# Patient Record
Sex: Female | Born: 1990 | Race: White | Hispanic: No | Marital: Married | Smoking: Former smoker
Health system: Southern US, Community
[De-identification: ages and names within clinical notes are randomized; demographics above are authoritative.]

## PROBLEM LIST (undated history)

## (undated) DIAGNOSIS — F909 Attention-deficit hyperactivity disorder, unspecified type: Secondary | ICD-10-CM

## (undated) DIAGNOSIS — J45909 Unspecified asthma, uncomplicated: Secondary | ICD-10-CM

## (undated) DIAGNOSIS — N946 Dysmenorrhea, unspecified: Secondary | ICD-10-CM

## (undated) DIAGNOSIS — T7840XA Allergy, unspecified, initial encounter: Secondary | ICD-10-CM

## (undated) DIAGNOSIS — L658 Other specified nonscarring hair loss: Secondary | ICD-10-CM

## (undated) HISTORY — DX: Other specified nonscarring hair loss: L65.8

## (undated) HISTORY — DX: Dysmenorrhea, unspecified: N94.6

## (undated) HISTORY — DX: Attention-deficit hyperactivity disorder, unspecified type: F90.9

## (undated) HISTORY — PX: WISDOM TOOTH EXTRACTION: SHX21

## (undated) HISTORY — DX: Allergy, unspecified, initial encounter: T78.40XA

## (undated) HISTORY — DX: Unspecified asthma, uncomplicated: J45.909

---
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
## 2016-12-30 ENCOUNTER — Ambulatory Visit (INDEPENDENT_AMBULATORY_CARE_PROVIDER_SITE_OTHER): Payer: BLUE CROSS/BLUE SHIELD | Admitting: Physician Assistant

## 2016-12-30 ENCOUNTER — Encounter: Payer: Self-pay | Admitting: Physician Assistant

## 2016-12-30 VITALS — BP 122/90 | HR 113 | Temp 98.7°F | Ht 62.0 in | Wt 169.0 lb

## 2016-12-30 DIAGNOSIS — J029 Acute pharyngitis, unspecified: Secondary | ICD-10-CM | POA: Insufficient documentation

## 2016-12-30 DIAGNOSIS — R000 Tachycardia, unspecified: Secondary | ICD-10-CM | POA: Diagnosis not present

## 2016-12-30 DIAGNOSIS — J039 Acute tonsillitis, unspecified: Secondary | ICD-10-CM | POA: Diagnosis not present

## 2016-12-30 DIAGNOSIS — Z7689 Persons encountering health services in other specified circumstances: Secondary | ICD-10-CM

## 2016-12-30 LAB — CBC WITH DIFFERENTIAL/PLATELET
BASOS PCT: 0 %
Basophils Absolute: 0 cells/uL (ref 0–200)
EOS PCT: 0 %
Eosinophils Absolute: 0 cells/uL — ABNORMAL LOW (ref 15–500)
HCT: 42.4 % (ref 35.0–45.0)
Hemoglobin: 14.4 g/dL (ref 11.7–15.5)
LYMPHS PCT: 12 %
Lymphs Abs: 2148 cells/uL (ref 850–3900)
MCH: 29.1 pg (ref 27.0–33.0)
MCHC: 34 g/dL (ref 32.0–36.0)
MCV: 85.7 fL (ref 80.0–100.0)
MONOS PCT: 5 %
MPV: 11 fL (ref 7.5–12.5)
Monocytes Absolute: 895 cells/uL (ref 200–950)
Neutro Abs: 14857 cells/uL — ABNORMAL HIGH (ref 1500–7800)
Neutrophils Relative %: 83 %
PLATELETS: 340 10*3/uL (ref 140–400)
RBC: 4.95 MIL/uL (ref 3.80–5.10)
RDW: 13.8 % (ref 11.0–15.0)
WBC: 17.9 10*3/uL — ABNORMAL HIGH (ref 3.8–10.8)

## 2016-12-30 LAB — POCT RAPID STREP A (OFFICE): Rapid Strep A Screen: NEGATIVE

## 2016-12-30 NOTE — Patient Instructions (Addendum)
-   Ibuprofen 800mg  every 8 hours as needed for pain/fever - Cepacol throat lozenges / spray - Warm salt water gargles - Drink at least 1 liter of water per day - Return if no improvement in 1 week or worsening symptoms including difficulty swallowing your saliva and voice change  Tonsillitis Tonsillitis is an infection of the throat that causes the tonsils to become red, tender, and swollen. Tonsils are collections of lymphoid tissue at the back of the throat. Each tonsil has crevices (crypts). Tonsils help fight nose and throat infections and keep infection from spreading to other parts of the body for the first 18 months of life. What are the causes? Sudden (acute) tonsillitis is usually caused by infection with streptococcal bacteria. Long-lasting (chronic) tonsillitis occurs when the crypts of the tonsils become filled with pieces of food and bacteria, which makes it easy for the tonsils to become repeatedly infected. What are the signs or symptoms? Symptoms of tonsillitis include:  A sore throat, with possible difficulty swallowing.  White patches on the tonsils.  Fever.  Tiredness.  New episodes of snoring during sleep, when you did not snore before.  Small, foul-smelling, yellowish-white pieces of material (tonsilloliths) that you occasionally cough up or spit out. The tonsilloliths can also cause you to have bad breath.  How is this diagnosed? Tonsillitis can be diagnosed through a physical exam. Diagnosis can be confirmed with the results of lab tests, including a throat culture. How is this treated? The goals of tonsillitis treatment include the reduction of the severity and duration of symptoms and prevention of associated conditions. Symptoms of tonsillitis can be improved with the use of steroids to reduce the swelling. Tonsillitis caused by bacteria can be treated with antibiotic medicines. Usually, treatment with antibiotic medicines is started before the cause of the  tonsillitis is known. However, if it is determined that the cause is not bacterial, antibiotic medicines will not treat the tonsillitis. If attacks of tonsillitis are severe and frequent, your health care provider may recommend surgery to remove the tonsils (tonsillectomy). Follow these instructions at home:  Rest as much as possible and get plenty of sleep.  Drink plenty of fluids. While the throat is very sore, eat soft foods or liquids, such as sherbet, soups, or instant breakfast drinks.  Eat frozen ice pops.  Gargle with a warm or cold liquid to help soothe the throat. Mix 1/4 teaspoon of salt and 1/4 teaspoon of baking soda in 8 oz of water. Contact a health care provider if:  Large, tender lumps develop in your neck.  A rash develops.  A green, yellow-brown, or bloody substance is coughed up.  You are unable to swallow liquids or food for 24 hours.  You notice that only one of the tonsils is swollen. Get help right away if:  You develop any new symptoms such as vomiting, severe headache, stiff neck, chest pain, or trouble breathing or swallowing.  You have severe throat pain along with drooling or voice changes.  You have severe pain, unrelieved with recommended medications.  You are unable to fully open the mouth.  You develop redness, swelling, or severe pain anywhere in the neck.  You have a fever. This information is not intended to replace advice given to you by your health care provider. Make sure you discuss any questions you have with your health care provider. Document Released: 04/22/2005 Document Revised: 12/19/2015 Document Reviewed: 12/30/2012 Elsevier Interactive Patient Education  2017 [employer]

## 2016-12-30 NOTE — Progress Notes (Signed)
HPI:                                                                Sonya West is a 26 y.o. female who [facility] [facility] today to establish care  Current Concerns include sore throat  Sore Throat   This is a recurrent problem. The current episode started in the past 7 days (Patient reports onset 1 week ago, resolved, and returned yesterday). The problem has been gradually worsening. Neither side of throat is experiencing more pain than the other. There has been no fever. The pain is moderate. Pertinent negatives include no coughing, drooling, ear pain, hoarse voice, plugged ear sensation, neck pain, shortness of breath, stridor or trouble swallowing. Associated symptoms comments: + chills + malaise. She has had no exposure to strep or mono. Treatments tried: Cough drops, hot cocoa. The treatment provided no relief.   Health Maintenance Health Maintenance  Topic Date Due  . HIV Screening  07/03/2007  . TETANUS/TDAP  07/03/2011  . PAP SMEAR  07/02/2013  . INFLUENZA VACCINE  02/24/2017    Past Medical History:  Diagnosis Date  . ADHD   . Allergy   . Asthma   . Dysmenorrhea    Past Surgical History:  Procedure Laterality Date  . WISDOM TOOTH EXTRACTION     Social History  Substance Use Topics  . Smoking status: Former Smoker    Types: Cigarettes    Quit date: 07/02/2012  . Smokeless tobacco: Never Used     Comment: smoked <1 year  . Alcohol use Yes     Comment: 1 drink every few months   family history includes Cancer in her paternal grandfather; Diabetes in her paternal grandfather; Heart attack in her maternal grandfather.  ROS: negative except as noted in the HPI  Medications: No current outpatient prescriptions on file.   No current facility-administered medications for this visit.    Allergies  Allergen Reactions  . Azithromycin Other (See Comments)    Blisters in mouth, ? SJS       Objective:  BP  122/90   Pulse (!) 113   Temp 98.7 F (37.1 C) (Oral)   Ht 5\' 2"  (1.575 m)   Wt 169 lb (76.7 kg)   LMP 12/29/2016 (Exact Date)   SpO2 99%   BMI 30.91 kg/m  Gen: well-groomed, cooperative, not ill-appearing, no distress HEENT: normal conjunctiva, TM's clear, oropharynx with erythema, right tonsil enlarged > left, no exudates, uvula midline, neck supple, trachea midline Pulm: Normal work of breathing, normal phonation, clear to auscultation bilaterally CV: Tachycardic, regular rhythm, s1 and s2 distinct, no murmurs, clicks or rubs, no carotid bruit Neuro: alert and oriented x 3, EOM's intact, normal tone, no tremor MSK: moving all extremities, normal gait and station, no peripheral edema Skin: warm and dry, no rashes or lesions on exposed skin Psych: normal affect, euthymic mood, normal speech and thought content   No results found for this or any previous visit (from the past 72 hour(s)). No results found.    Assessment and Plan: 26 y.o. female with   1. Encounter to establish care - reviewed PMH/PSH - negative PHQ2/depression screen - Pap smear due  2. Tonsillopharyngitis - no signs of peritonsillar  abscess - Rapid Strep A negative - Culture, Group A Strep pending - Epstein-Barr virus VCA antibody panel - CBC with Differential/Platelet - will treat symptomatically pending culture - educated patient on red flag symptoms warranting early follow-up  3. Tachycardia with heart rate 100-120 beats per minute - rhythm regular, patient denies palpitations, chest pain, lightheadedness, syncope. She does endorse poor PO fluid intake today. Likely 2/2 dehydration from poor PO intake - push PO fluids   Patient education and anticipatory guidance given Patient agrees with treatment plan Follow-up in 1 month for CPE with Pap or sooner as needed  Levonne Hubert PA-C

## 2016-12-31 LAB — EPSTEIN-BARR VIRUS VCA ANTIBODY PANEL
EBV NA IGG: 365 U/mL — AB
EBV VCA IgG: 87.8 U/mL — ABNORMAL HIGH

## 2017-01-01 LAB — CULTURE, GROUP A STREP

## 2017-01-01 NOTE — Progress Notes (Signed)
1. Strep culture was negative 2. Blood work shows she had Mono in the past, but does not have the active infection now 3. She does have an elevated white blood cell count. This is most likely a virus causing her infection. I recommend we repeat her CBC this week to make sure the WBC count is coming down.  Return if difficulty swallowing, voice change, high fevers or no improvement in sore throat

## 2017-01-06 ENCOUNTER — Other Ambulatory Visit: Payer: Self-pay | Admitting: Physician Assistant

## 2017-01-06 DIAGNOSIS — D72829 Elevated white blood cell count, unspecified: Secondary | ICD-10-CM

## 2017-01-06 LAB — CBC WITH DIFFERENTIAL/PLATELET
BASOS ABS: 0 {cells}/uL (ref 0–200)
Basophils Relative: 0 %
EOS ABS: 186 {cells}/uL (ref 15–500)
Eosinophils Relative: 2 %
HEMATOCRIT: 42.6 % (ref 35.0–45.0)
HEMOGLOBIN: 14.4 g/dL (ref 11.7–15.5)
LYMPHS ABS: 2790 {cells}/uL (ref 850–3900)
Lymphocytes Relative: 30 %
MCH: 28.6 pg (ref 27.0–33.0)
MCHC: 33.8 g/dL (ref 32.0–36.0)
MCV: 84.5 fL (ref 80.0–100.0)
MONO ABS: 744 {cells}/uL (ref 200–950)
MPV: 10.8 fL (ref 7.5–12.5)
Monocytes Relative: 8 %
NEUTROS PCT: 60 %
Neutro Abs: 5580 cells/uL (ref 1500–7800)
Platelets: 387 10*3/uL (ref 140–400)
RBC: 5.04 MIL/uL (ref 3.80–5.10)
RDW: 14 % (ref 11.0–15.0)
WBC: 9.3 10*3/uL (ref 3.8–10.8)

## 2017-01-07 NOTE — Progress Notes (Signed)
White blood cell count is back in a normal range

## 2017-07-14 ENCOUNTER — Ambulatory Visit (INDEPENDENT_AMBULATORY_CARE_PROVIDER_SITE_OTHER): Payer: BLUE CROSS/BLUE SHIELD | Admitting: Physician Assistant

## 2017-07-14 ENCOUNTER — Encounter: Payer: Self-pay | Admitting: Physician Assistant

## 2017-07-14 VITALS — BP 138/90 | HR 88 | Wt 173.0 lb

## 2017-07-14 DIAGNOSIS — L658 Other specified nonscarring hair loss: Secondary | ICD-10-CM

## 2017-07-14 DIAGNOSIS — Z130 Encounter for screening for diseases of the blood and blood-forming organs and certain disorders involving the immune mechanism: Secondary | ICD-10-CM

## 2017-07-14 DIAGNOSIS — Z1329 Encounter for screening for other suspected endocrine disorder: Secondary | ICD-10-CM

## 2017-07-14 LAB — COMPREHENSIVE METABOLIC PANEL
AG RATIO: 1.3 (calc) (ref 1.0–2.5)
ALT: 8 U/L (ref 6–29)
AST: 11 U/L (ref 10–30)
Albumin: 4.3 g/dL (ref 3.6–5.1)
Alkaline phosphatase (APISO): 67 U/L (ref 33–115)
BUN: 12 mg/dL (ref 7–25)
CHLORIDE: 104 mmol/L (ref 98–110)
CO2: 26 mmol/L (ref 20–32)
Calcium: 9.8 mg/dL (ref 8.6–10.2)
Creat: 0.61 mg/dL (ref 0.50–1.10)
GLOBULIN: 3.3 g/dL (ref 1.9–3.7)
GLUCOSE: 85 mg/dL (ref 65–99)
Potassium: 3.8 mmol/L (ref 3.5–5.3)
Sodium: 138 mmol/L (ref 135–146)
Total Bilirubin: 0.6 mg/dL (ref 0.2–1.2)
Total Protein: 7.6 g/dL (ref 6.1–8.1)

## 2017-07-14 LAB — CBC
HEMATOCRIT: 41.4 % (ref 35.0–45.0)
HEMOGLOBIN: 14.4 g/dL (ref 11.7–15.5)
MCH: 29.1 pg (ref 27.0–33.0)
MCHC: 34.8 g/dL (ref 32.0–36.0)
MCV: 83.6 fL (ref 80.0–100.0)
MPV: 11.6 fL (ref 7.5–12.5)
PLATELETS: 367 10*3/uL (ref 140–400)
RBC: 4.95 10*6/uL (ref 3.80–5.10)
RDW: 12.6 % (ref 11.0–15.0)
WBC: 8.2 10*3/uL (ref 3.8–10.8)

## 2017-07-14 LAB — T4, FREE: Free T4: 1.1 ng/dL (ref 0.8–1.8)

## 2017-07-14 LAB — IRON,TIBC AND FERRITIN PANEL
%SAT: 29 % (ref 11–50)
Ferritin: 64 ng/mL (ref 10–154)
Iron: 103 ug/dL (ref 40–190)
TIBC: 355 mcg/dL (calc) (ref 250–450)

## 2017-07-14 LAB — TSH: TSH: 1.81 mIU/L

## 2017-07-14 MED ORDER — SPIRONOLACTONE 25 MG PO TABS: 25.0000 mg | ORAL_TABLET | Freq: Every day | ORAL | 2 refills | Status: DC

## 2017-07-14 NOTE — Patient Instructions (Addendum)
Androgenic Allopecia: - labs today to rule out a metabolic cause for hair loss - spironolactone daily to help prevent hair loss. This medication can cause life-threatening electrolyte abnormalities and requires regular lab monitoring every 3-6 months - follow-up in 3 months

## 2017-07-14 NOTE — Progress Notes (Signed)
HPI:                                                                Sonya West is a 26 y.o. female who [facility] today for hair loss  Pleasant 26 yo F with PMH of ADHD, asthma and dysmenorrhea reports hair has been progressively thinning for the last 10 years. Reports excessive hair shedding as well. She denies rashes on her scalp or elsewhere. Denies joint swelling or pain. She reports a history of acne around the time of menarche and through her teenage years. She reports menstrual periods are regular, last 5-7 days, and denies menorrhagia.  Denies family history of thyroid disease.  Past Medical History:  Diagnosis Date  . ADHD   . Allergy   . Asthma   . Dysmenorrhea    Past Surgical History:  Procedure Laterality Date  . WISDOM TOOTH EXTRACTION     Social History   Tobacco Use  . Smoking status: Former Smoker    Types: Cigarettes    Last attempt to quit: 07/02/2012    Years since quitting: 5.0  . Smokeless tobacco: Never Used  . Tobacco comment: smoked <1 year  Substance Use Topics  . Alcohol use: Yes    Comment: 1 drink every few months   family history includes Cancer in her paternal grandfather; Diabetes in her paternal grandfather; Heart attack in her maternal grandfather.  ROS: negative except as noted in the HPI  Medications: Current Outpatient Medications  Medication Sig Dispense Refill  . albuterol (PROVENTIL HFA;VENTOLIN HFA) 108 (90 Base) MCG/ACT inhaler Inhale 2 puffs into the lungs every 6 (six) hours as needed. 30 minutes before activity    . Biotin 10 MG TABS Take by mouth.    . spironolactone (ALDACTONE) 25 MG tablet Take 1 tablet (25 mg total) by mouth daily. 30 tablet 2   No current facility-administered medications for this visit.    Allergies  Allergen Reactions  . Azithromycin Other (See Comments)    Blisters in mouth, ? SJS       Objective:  BP 138/90   Pulse 88   Wt 173  lb (78.5 kg)   LMP 06/17/2017 (Exact Date)   BMI 31.64 kg/m  Gen:  alert, not ill-appearing, no distress, appropriate for age, obese female HEENT: head normocephalic without obvious abnormality, conjunctiva and cornea clear, trachea midline Pulm: Normal work of breathing, normal phonation Neuro: alert and oriented x 3, no tremor MSK: extremities atraumatic, normal gait and station Skin: hair is visibly thin with scalp visible where hair naturally parts, grade 3-4, positive hair pull test, no bald patches, no rashes or lesions Psych: well-groomed, cooperative, good eye contact, euthymic mood, affect mood-congruent, speech is articulate, and thought processes clear and goal-directed  [facility] 12/30/2016  Decreased Interest 0  Down, Depressed, Hopeless 0  PHQ - 2 Score 0     No results found for this or any previous visit (from the past 72 hour(s)). No results found.    Assessment and Plan: 26 y.o. 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 She does  not appear to have symptoms or features of PCOS. Hair loss is currently grade 3-4. We will check labs and start low-dose Spironolactone. Follow-up in 12 weeks. She understands she will need regular BMP to monitor the medication for electrolyte abnormalities - CBC - TSH - T4, free - Comprehensive metabolic panel - spironolactone (ALDACTONE) 25 MG tablet; Take 1 tablet (25 mg total) by mouth daily.  Dispense: 30 tablet; Refill: 2 - Fe+TIBC+Fer  2. Screening for thyroid disorder - TSH - T4, free  3. Screening for blood disease - CBC - Comprehensive metabolic panel - Fe+TIBC+Fer   Patient education and anticipatory guidance given Patient agrees with treatment plan Follow-up in 12 weeks or sooner as needed if symptoms worsen or fail to improve  Levonne Hubertharley E. Evelia Waskey PA-C

## 2017-07-15 NOTE — Progress Notes (Signed)
Labs look great No anemia or thyroid disease Treatment plan does not change

## 2017-07-25 ENCOUNTER — Encounter: Payer: Self-pay | Admitting: Physician Assistant

## 2017-07-25 DIAGNOSIS — L658 Other specified nonscarring hair loss: Secondary | ICD-10-CM | POA: Insufficient documentation

## 2017-09-03 ENCOUNTER — Other Ambulatory Visit: Payer: Self-pay

## 2017-09-03 DIAGNOSIS — L658 Other specified nonscarring hair loss: Secondary | ICD-10-CM

## 2017-09-03 MED ORDER — SPIRONOLACTONE 25 MG PO TABS: 25.0000 mg | ORAL_TABLET | Freq: Every day | ORAL | 0 refills | Status: DC

## 2017-09-13 ENCOUNTER — Ambulatory Visit (INDEPENDENT_AMBULATORY_CARE_PROVIDER_SITE_OTHER): Payer: BLUE CROSS/BLUE SHIELD | Admitting: Physician Assistant

## 2017-09-13 ENCOUNTER — Encounter: Payer: Self-pay | Admitting: Physician Assistant

## 2017-09-13 VITALS — BP 128/95 | HR 80 | Temp 98.2°F | Wt 178.0 lb

## 2017-09-13 DIAGNOSIS — K130 Diseases of lips: Secondary | ICD-10-CM

## 2017-09-13 MED ORDER — VALACYCLOVIR HCL 1 G PO TABS
2000.0000 mg | ORAL_TABLET | Freq: Two times a day (BID) | ORAL | 0 refills | Status: AC
Start: 2017-09-13 — End: 2017-09-14

## 2017-09-13 MED ORDER — HYDROCORTISONE 1 % EX GEL: CUTANEOUS | 0 refills | Status: DC

## 2017-09-13 NOTE — Patient Instructions (Signed)
-   valtrex 2 tabs tonight and 2 tablets tomorrow morning - hydrocortisone gel twice a day for 1 week - apply only Aquafor or Vaseline for moisture. Apply at least 20 minutes after the Hydrocortisone

## 2017-09-13 NOTE — Progress Notes (Signed)
HPI:                                                                Sonya West is a 27 y.o. female who [facility] today for lip lesions and swelling  Patient reports 3 days of upper and lower lip swelling with multiple tiny lesions that are draining yellowish fluid. She reports this happened to her last year and resolved on its own. She has no history of HSV. Denies fever, chills, malaise, myalgias, sore throat, mouth pain, dental pain. She has been applying warm compresses and Vaseline.   Depression screen PHQ 2/9 12/30/2016  Decreased Interest 0  Down, Depressed, Hopeless 0  PHQ - 2 Score 0    No flowsheet data found.    Past Medical History:  Diagnosis Date  . ADHD   . Allergy   . Asthma   . Dysmenorrhea    Past Surgical History:  Procedure Laterality Date  . WISDOM TOOTH EXTRACTION     Social History   Tobacco Use  . Smoking status: Former Smoker    Types: Cigarettes    Last attempt to quit: 07/02/2012    Years since quitting: 5.2  . Smokeless tobacco: Never Used  . Tobacco comment: smoked <1 year  Substance Use Topics  . Alcohol use: Yes    Comment: 1 drink every few months   family history includes Cancer in her paternal grandfather; Diabetes in her paternal grandfather; Heart attack in her maternal grandfather.    ROS: negative except as noted in the HPI  Medications: Current Outpatient Medications  Medication Sig Dispense Refill  . albuterol (PROVENTIL HFA;VENTOLIN HFA) 108 (90 Base) MCG/ACT inhaler Inhale 2 puffs into the lungs every 6 (six) hours as needed. 30 minutes before activity    . spironolactone (ALDACTONE) 25 MG tablet Take 1 tablet (25 mg total) by mouth daily. 30 tablet 0  . HYDROCORTISONE, TOPICAL, 1 % GEL Apply topically to affected areas twice a day for 1 week  0   No current facility-administered medications for this visit.    Allergies  Allergen Reactions  . Azithromycin  Other (See Comments)    Blisters in mouth, ? SJS       Objective:  BP (!) 128/95   Pulse 80   Temp 98.2 F (36.8 C) (Oral)   Wt 178 lb (80.7 kg)   SpO2 98%   BMI 32.56 kg/m  Gen:  alert, not ill-appearing, no distress, appropriate for age HEENT: head normocephalic without obvious abnormality, conjunctiva and cornea clear, oropharynx clear, no lesions of the buccal mucosa, neck supple, no adenopathy, trachea midline Pulm: Normal work of breathing, normal phonation Neuro: alert and oriented x 3, no tremor MSK: extremities atraumatic, normal gait and station Skin: vermilion border of upper and lower lip with multiple vesicular lesions, lips are mildly swollen     No results found for this or any previous visit (from the past 72 hour(s)). No results found.    Assessment and Plan: 27 y.o. female with   1. Lesion of lip - differential includes HSV infection, allergic reaction, and cheilitis. Viral culture pending. Treating empirically for HSV. Hydrocortisone twice a day for 1 week. Aquafor/Vaseline.  - valACYclovir (VALTREX) 1000  MG tablet; Take 2 tablets (2,000 mg total) by mouth 2 (two) times daily for 1 day.  Dispense: 4 tablet; Refill: 0 - HYDROCORTISONE, TOPICAL, 1 % GEL; Apply topically to affected areas twice a day for 1 week; Refill: 0  Patient education and anticipatory guidance given Patient agrees with treatment plan Follow-up in 1 week or sooner as needed if symptoms worsen or fail to improve  Levonne Hubert PA-C

## 2017-09-20 ENCOUNTER — Ambulatory Visit: Payer: BLUE CROSS/BLUE SHIELD | Admitting: Physician Assistant

## 2017-09-20 ENCOUNTER — Encounter: Payer: Self-pay | Admitting: Physician Assistant

## 2017-09-20 ENCOUNTER — Telehealth: Payer: Self-pay

## 2017-09-20 VITALS — BP 131/86 | HR 83 | Temp 98.3°F | Wt 178.0 lb

## 2017-09-20 DIAGNOSIS — K130 Diseases of lips: Secondary | ICD-10-CM | POA: Insufficient documentation

## 2017-09-20 MED ORDER — VALACYCLOVIR HCL 1 G PO TABS: 2000.0000 mg | ORAL_TABLET | Freq: Two times a day (BID) | ORAL | 5 refills | Status: AC

## 2017-09-20 NOTE — Telephone Encounter (Signed)
Spoke with CSR: Carnette.  She stated that the results for the viral culture could take from 1-21 days to result. -EH/RMA

## 2017-09-20 NOTE — Progress Notes (Signed)
HPI:                                                                Sonya West is a 27 y.o. female who [facility] today for follow-up lip swelling/lesions  She completed one round of Valtrex and reports lesions resolved within 3 days. Denies fever, chills, oral lesions, odynophagia, pharyngitis.  Depression screen PHQ 2/9 12/30/2016  Decreased Interest 0  Down, Depressed, Hopeless 0  PHQ - 2 Score 0    No flowsheet data found.    Past Medical History:  Diagnosis Date  . ADHD   . Allergy   . Asthma   . Dysmenorrhea    Past Surgical History:  Procedure Laterality Date  . WISDOM TOOTH EXTRACTION     Social History   Tobacco Use  . Smoking status: Former Smoker    Types: Cigarettes    Last attempt to quit: 07/02/2012    Years since quitting: 5.2  . Smokeless tobacco: Never Used  . Tobacco comment: smoked <1 year  Substance Use Topics  . Alcohol use: Yes    Comment: 1 drink every few months   family history includes Cancer in her paternal grandfather; Diabetes in her paternal grandfather; Heart attack in her maternal grandfather.    ROS: negative except as noted in the HPI  Medications: Current Outpatient Medications  Medication Sig Dispense Refill  . albuterol (PROVENTIL HFA;VENTOLIN HFA) 108 (90 Base) MCG/ACT inhaler Inhale 2 puffs into the lungs every 6 (six) hours as needed. 30 minutes before activity    . HYDROCORTISONE, TOPICAL, 1 % GEL Apply topically to affected areas twice a day for 1 week  0  . spironolactone (ALDACTONE) 25 MG tablet Take 1 tablet (25 mg total) by mouth daily. 30 tablet 0   No current facility-administered medications for this visit.    Allergies  Allergen Reactions  . Azithromycin Other (See Comments)    Blisters in mouth, ? SJS       Objective:  BP 131/86   Pulse 83   Temp 98.3 F (36.8 C) (Oral)   Wt 178 lb (80.7 kg)   BMI 32.56 kg/m  Gen:  alert, not  ill-appearing, no distress, appropriate for age, obese female HEENT: head normocephalic without obvious abnormality, conjunctiva and cornea clear, lips without edema or visible lesions, trachea midline Pulm: Normal work of breathing, normal phonation Neuro: alert and oriented x 3, no tremor MSK: extremities atraumatic, normal gait and station Skin: intact, no rashes on exposed skin, no jaundice, no cyanosis Psych: well-groomed, cooperative, good eye contact, euthymic mood, affect mood-congruent, speech is articulate, and thought processes clear and goal-directed    No results found for this or any previous visit (from the past 72 hour(s)). No results found.    Assessment and Plan: 27 y.o. female with   Lip lesion - viral culture never resulted from lab to confirm HSV labialis - clinical improvement with Valtrex - placing prescription for anti-viral on file with her pharmacy. Take at the first sign of recurrence     Patient education and anticipatory guidance given Patient agrees with treatment plan Follow-up as needed if symptoms worsen or fail to improve  Levonne Hubertharley E. Mcihael Hinderman PA-C

## 2017-10-06 LAB — VIRAL CULTURE VIRC
MICRO NUMBER:: 90212919
SPECIMEN QUALITY:: ADEQUATE

## 2017-10-12 ENCOUNTER — Encounter: Payer: Self-pay | Admitting: Physician Assistant

## 2017-10-12 ENCOUNTER — Ambulatory Visit: Payer: BLUE CROSS/BLUE SHIELD | Admitting: Physician Assistant

## 2017-10-12 VITALS — BP 122/87 | HR 67 | Resp 16 | Wt 176.0 lb

## 2017-10-12 DIAGNOSIS — Z79899 Other long term (current) drug therapy: Secondary | ICD-10-CM

## 2017-10-12 DIAGNOSIS — L658 Other specified nonscarring hair loss: Secondary | ICD-10-CM

## 2017-10-12 DIAGNOSIS — Z5181 Encounter for therapeutic drug level monitoring: Secondary | ICD-10-CM

## 2017-10-12 MED ORDER — SPIRONOLACTONE 50 MG PO TABS: ORAL_TABLET | ORAL | 2 refills | Status: DC

## 2017-10-12 NOTE — Progress Notes (Signed)
HPI:                                                                Sonya West is a 27 y.o. female who [facility] Primary [facility] today for hair loss follow-up  Pleasant 27 yo F with PMH of ADHD, asthma and dysmenorrhea with progressively thinning hair for the last 10 years. Reports excessive hair shedding as well. She was started on Spironolactone 25 mg, 3 months ago. Has not noticed any change, but has not noticed worsening hair loss either. Her labs including CBC, iron studies, CMP, and thyroid studies were unremarkable.  She denies rashes on her scalp or elsewhere. Denies joint swelling or pain. Denies new symptoms.  [facility]/12/2017 12/30/2016  Decreased Interest 0 0  Down, Depressed, Hopeless 0 0  PHQ - 2 Score 0 0    No flowsheet data found.    Past Medical History:  Diagnosis Date  . ADHD   . Allergy   . Asthma   . Dysmenorrhea    Past Surgical History:  Procedure Laterality Date  . WISDOM TOOTH EXTRACTION     Social History   Tobacco Use  . Smoking status: Former Smoker    Types: Cigarettes    Last attempt to quit: 07/02/2012    Years since quitting: 5.2  . Smokeless tobacco: Never Used  . Tobacco comment: smoked <1 year  Substance Use Topics  . Alcohol use: Yes    Comment: 1 drink every few months   family history includes Cancer in her paternal grandfather; Diabetes in her paternal grandfather; Heart attack in her maternal grandfather.    ROS: negative except as noted in the HPI  Medications: Current Outpatient Medications  Medication Sig Dispense Refill  . albuterol (PROVENTIL HFA;VENTOLIN HFA) 108 (90 Base) MCG/ACT inhaler Inhale 2 puffs into the lungs every 6 (six) hours as needed. 30 minutes before activity    . HYDROCORTISONE, TOPICAL, 1 % GEL Apply topically to affected areas twice a day for 1 week  0  . spironolactone (ALDACTONE) 50 MG tablet Take 1 tablet (50 mg total) by mouth  daily for 7 days, THEN 2 tablets (100 mg total) daily for 23 days. 60 tablet 2   No current facility-administered medications for this visit.    Allergies  Allergen Reactions  . Azithromycin Other (See Comments)    Blisters in mouth, ? SJS       Objective:  BP 122/87   Pulse 67   Resp 16   Wt 176 lb (79.8 kg)   LMP 10/11/2017 (Exact Date)   BMI 32.19 kg/m  Gen:  alert, not ill-appearing, no distress, appropriate for age HEENT: head normocephalic without obvious abnormality, conjunctiva and cornea clear, trachea midline Pulm: Normal work of breathing, normal phonation  Neuro: alert and oriented x 3, no tremor MSK: extremities atraumatic, normal gait and station Skin:hair is visibly thin with scalp visible where hair naturally parts, grade II-2 Ludwig, no bald patches, no rashes or lesions Psych: well-groomed, cooperative, good eye contact, euthymic mood, affect mood-congruent, speech is articulate, and thought processes clear and goal-directed  Lab Results  Component Value Date   WBC 8.2 07/14/2017   HGB 14.4 07/14/2017   HCT  41.4 07/14/2017   MCV 83.6 07/14/2017   PLT 367 07/14/2017   Lab Results  Component Value Date   IRON 103 07/14/2017   TIBC 355 07/14/2017   FERRITIN 64 07/14/2017   Lab Results  Component Value Date   TSH 1.81 07/14/2017   Lab Results  Component Value Date   CREATININE 0.61 07/14/2017   BUN 12 07/14/2017   NA 138 07/14/2017   K 3.8 07/14/2017   CL 104 07/14/2017   CO2 26 07/14/2017     No results found for this or any previous visit (from the past 72 hour(s)). No results found.    Assessment and Plan: 27 y.o. 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 Referring to dermatology for further management - spironolactone (ALDACTONE) 50 MG tablet; Take 1 tablet (50 mg total) by mouth daily for 7 days, THEN 2 tablets (100 mg total) daily for 23 days.  Dispense: 60 tablet; Refill:  2 - Ambulatory referral to Dermatology  2. Encounter for monitoring diuretic therapy - Basic metabolic panel  Patient education and anticipatory guidance given Patient agrees with treatment plan Follow-up as needed if symptoms worsen or fail to improve  Levonne Hubert PA-C

## 2017-10-12 NOTE — Patient Instructions (Signed)
-   1 tablet daily for 1 week, then increase to 2 tablets daily - follow-up with dermatology

## 2017-10-13 LAB — BASIC METABOLIC PANEL
BUN: 8 mg/dL (ref 7–25)
CALCIUM: 9.3 mg/dL (ref 8.6–10.2)
CO2: 23 mmol/L (ref 20–32)
Chloride: 105 mmol/L (ref 98–110)
Creat: 0.59 mg/dL (ref 0.50–1.10)
Glucose, Bld: 82 mg/dL (ref 65–99)
POTASSIUM: 3.9 mmol/L (ref 3.5–5.3)
SODIUM: 138 mmol/L (ref 135–146)

## 2017-11-09 ENCOUNTER — Encounter: Payer: Self-pay | Admitting: Physician Assistant

## 2017-11-09 ENCOUNTER — Ambulatory Visit (INDEPENDENT_AMBULATORY_CARE_PROVIDER_SITE_OTHER): Payer: BLUE CROSS/BLUE SHIELD | Admitting: Physician Assistant

## 2017-11-09 VITALS — BP 135/100 | HR 97 | Temp 98.4°F | Ht 62.0 in | Wt 165.0 lb

## 2017-11-09 DIAGNOSIS — J029 Acute pharyngitis, unspecified: Secondary | ICD-10-CM

## 2017-11-09 DIAGNOSIS — J039 Acute tonsillitis, unspecified: Secondary | ICD-10-CM | POA: Diagnosis not present

## 2017-11-09 DIAGNOSIS — J0100 Acute maxillary sinusitis, unspecified: Secondary | ICD-10-CM | POA: Insufficient documentation

## 2017-11-09 DIAGNOSIS — J4520 Mild intermittent asthma, uncomplicated: Secondary | ICD-10-CM | POA: Diagnosis not present

## 2017-11-09 MED ORDER — AMOXICILLIN-POT CLAVULANATE 875-125 MG PO TABS: 1.0000 | ORAL_TABLET | Freq: Two times a day (BID) | ORAL | 0 refills | Status: AC

## 2017-11-09 MED ORDER — ALBUTEROL SULFATE HFA 108 (90 BASE) MCG/ACT IN AERS: 2.0000 | INHALATION_SPRAY | Freq: Four times a day (QID) | RESPIRATORY_TRACT | 1 refills | Status: DC | PRN

## 2017-11-09 NOTE — Patient Instructions (Signed)

## 2017-11-09 NOTE — Progress Notes (Signed)
   Subjective:    Patient ID: Sonya West, female    DOB: 08/05/1990, 27 y.o.   MRN: [id number]  HPI Sonya West presents today regarding a sore throat that she has had for the past week. She complains of associated left ear pain, some congestion, and a non-productive cough. She says that she feels like stuff is in her chest but she is unable to cough anything up. She denies fevers, chills, nausea, vomiting. She denies any recent sick contacts. She has tried ibuprofen, tylenol, mucinex OTC with minimal relief.   .. Active Ambulatory Problems    Diagnosis Date Noted  . Asthma 12/15/2007  . Attention deficit disorder 12/15/2007  . Dysmenorrhea 12/15/2007  . Tachycardia with heart rate 100-120 beats per minute 12/30/2016  . 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 Lip lesion 09/20/2017  . Acute non-recurrent maxillary sinusitis 11/09/2017   Resolved Ambulatory Problems    Diagnosis Date Noted  . Tonsillopharyngitis 12/30/2016   Past Medical History:  Diagnosis Date  . ADHD   . Allergy   . Asthma   . Dysmenorrhea       Review of Systems  Constitutional: Negative for activity change, appetite change, fatigue and fever.  HENT: Positive for congestion, postnasal drip, sinus pressure, sinus pain and sore throat.   Respiratory: Positive for cough. Negative for chest tightness and shortness of breath.   Gastrointestinal: Negative for abdominal pain, nausea and vomiting.  Musculoskeletal: Negative for myalgias.       Objective:   Physical Exam  Constitutional: She is oriented to person, place, and time. She appears well-developed and well-nourished.  HENT:  Head: Normocephalic and atraumatic.  Right Ear: External ear normal.  Left Ear: External ear normal.  Mouth/Throat: Posterior oropharyngeal erythema present. No oropharyngeal exudate.  Right tonsillar edema; TM dull bilaterally  Eyes: Conjunctivae are normal.  Neck: Normal range of motion.  Cardiovascular: Normal rate, regular  rhythm, normal heart sounds and intact distal pulses.  Pulmonary/Chest: Effort normal and breath sounds normal. No respiratory distress. She has no wheezes.  Musculoskeletal: Normal range of motion.  Lymphadenopathy:    She has cervical adenopathy.  Neurological: She is alert and oriented to person, place, and time.  Skin: Skin is warm and dry.  Psychiatric: She has a normal mood and affect. Her behavior is normal.  Nursing note and vitals reviewed.         Assessment & Plan:  Marland Kitchen.Marland Kitchen.Diagnoses and all orders for this visit:  Tonsillopharyngitis -     amoxicillin-clavulanate (AUGMENTIN) 875-125 MG tablet; Take 1 tablet by mouth 2 (two) times daily for 10 days.  Mild intermittent asthma, unspecified whether complicated -     albuterol (PROVENTIL HFA;VENTOLIN HFA) 108 (90 Base) MCG/ACT inhaler; Inhale 2 puffs into the lungs every 6 (six) hours as needed. 30 minutes before activity  Acute non-recurrent maxillary sinusitis -     amoxicillin-clavulanate (AUGMENTIN) 875-125 MG tablet; Take 1 tablet by mouth 2 (two) times daily for 10 days.  treated with augmentin. HO for symptomatic care.  I did not see any signs or symptoms significant for asthma however patient does have this on her problem list and requests an inhaler.  I will send over a rescue inhaler for her to use.  Certainly she can use Tylenol and Motrin for her sore throat pain. Follow up as needed.

## 2017-11-30 LAB — HM PAP SMEAR: HM Pap smear: NEGATIVE

## 2018-01-02 ENCOUNTER — Other Ambulatory Visit: Payer: Self-pay | Admitting: Physician Assistant

## 2018-01-02 DIAGNOSIS — L658 Other specified nonscarring hair loss: Secondary | ICD-10-CM

## 2018-03-25 ENCOUNTER — Telehealth: Payer: Self-pay | Admitting: Physician Assistant

## 2018-03-25 NOTE — Telephone Encounter (Signed)
Reports since increasing dose of Spironolactone she has been having heavier, more painful periods LMP 03/24/18 Instructed to hold her Spironolactone and is scheduled for f/u appt on Tuesday for menstrual irregularities

## 2018-03-25 NOTE — Telephone Encounter (Signed)
Left voicemail for patient to call clinic Will try again around 5 pm

## 2018-03-25 NOTE — Telephone Encounter (Signed)
[facility] this [facility]. She has concerns about one of her meds, spironolactone. She would like to stop taking this.

## 2018-03-29 ENCOUNTER — Encounter: Payer: Self-pay | Admitting: Physician Assistant

## 2018-03-29 ENCOUNTER — Ambulatory Visit (INDEPENDENT_AMBULATORY_CARE_PROVIDER_SITE_OTHER): Payer: BLUE CROSS/BLUE SHIELD | Admitting: Physician Assistant

## 2018-03-29 VITALS — BP 117/83 | HR 69 | Wt 156.0 lb

## 2018-03-29 DIAGNOSIS — N946 Dysmenorrhea, unspecified: Secondary | ICD-10-CM

## 2018-03-29 DIAGNOSIS — N939 Abnormal uterine and vaginal bleeding, unspecified: Secondary | ICD-10-CM | POA: Diagnosis not present

## 2018-03-29 DIAGNOSIS — N838 Other noninflammatory disorders of ovary, fallopian tube and broad ligament: Secondary | ICD-10-CM

## 2018-03-29 NOTE — Patient Instructions (Signed)
Abnormal Uterine Bleeding Abnormal uterine bleeding can affect women at various stages in life, including teenagers, women in their reproductive years, pregnant women, and women who have reached menopause. Several kinds of uterine bleeding are considered abnormal, including:  Bleeding or spotting between periods.  Bleeding after sexual intercourse.  Bleeding that is heavier or more than normal.  Periods that last longer than usual.  Bleeding after menopause. Many cases of abnormal uterine bleeding are minor and simple to treat, while others are more serious. Any type of abnormal bleeding should be evaluated by your health care provider. Treatment will depend on the cause of the bleeding. Follow these instructions at home: Monitor your condition for any changes. The following actions may help to alleviate any discomfort you are experiencing:  Avoid the use of tampons and douches as directed by your health care provider.  Change your pads frequently. You should get regular pelvic exams and Pap tests. Keep all follow-up appointments for diagnostic tests as directed by your health care provider. Contact a health care provider if:  Your bleeding lasts more than 1 week.  You feel dizzy at times. Get help right away if:  You pass out.  You are changing pads every 15 to 30 minutes.  You have abdominal pain.  You have a fever.  You become sweaty or weak.  You are passing large blood clots from the vagina.  You start to feel nauseous and vomit. This information is not intended to replace advice given to you by your health care provider. Make sure you discuss any questions you have with your health care provider. Document Released: 07/13/2005 Document Revised: 12/25/2015 Document Reviewed: 02/09/2013 Elsevier Interactive Patient Education  2017 Elsevier Inc.  

## 2018-03-29 NOTE — Progress Notes (Signed)
HPI:                                                                Sonya West is a 27 y.o. female who presents to [facility]: Primary Care Sports Medicine today for menstrual irregulariites  Menstrual irregularities beginning around March/April, around the time that her Spironolactone was increased LMP 03/24/18 Has had 1-2 cycles where she had intermenstrual bleeding/spotting twice in the same month. Has had 1-2 episodes of post-coital bleeding. Bleeding is heaviest on first 1-2 days of cycle, but otherwise bleeding is moderate and she reports is changing diva cup every 12 hours Cramping has been severe and she reports she has not had cramps this severe since she was an adolescent No prior history of menorrhagia. Denies bleeding gums/epistaxis/easy bruising.  Sexually active with 1 female partner (husband), uses condoms consistently. Denies fever, chills, weakness/fatigue, abdominal/pelvic pain, abnormal vaginal discharge, dyspareunia.  Pap UTD per patient, [facility] [place]    No flowsheet data found.    Past Medical History:  Diagnosis Date  . ADHD   . Allergy   . Asthma   . Dysmenorrhea    Past Surgical History:  Procedure Laterality Date  . WISDOM TOOTH EXTRACTION     Social History   Tobacco Use  . Smoking status: Former Smoker    Types: Cigarettes    Last attempt to quit: 07/02/2012    Years since quitting: 5.7  . Smokeless tobacco: Never Used  . Tobacco comment: smoked <1 year  Substance Use Topics  . Alcohol use: Yes    Comment: 1 drink every few months   family history includes Cancer in her paternal grandfather; Diabetes in her paternal grandfather; Heart attack in her maternal grandfather.    ROS: negative except as noted in the HPI  Medications: Current Outpatient Medications  Medication Sig Dispense Refill  . albuterol (PROVENTIL HFA;VENTOLIN HFA) 108 (90 Base) MCG/ACT inhaler Inhale 2 puffs into the lungs  every 6 (six) hours as needed. 30 minutes before activity 1 Inhaler 1  . HYDROCORTISONE, TOPICAL, 1 % GEL Apply topically to affected areas twice a day for 1 week  0   No current facility-administered medications for this visit.    Allergies  Allergen Reactions  . Azithromycin Other (See Comments)    Blisters in mouth, ? SJS       Objective:  BP 117/83   Pulse 69   Wt 156 lb (70.8 kg)   BMI 28.53 kg/m  Gen:  alert, not ill-appearing, no distress, appropriate for age GU: vulva without rashes or lesions, normal introitus and urethral meatus, vaginal mucosa without erythema, scant amount of blood at the cervical os with normal appearing white discharge, cervix non-friable without lesions, adnexa without masses or tenderness, uterus non-tender and not enlarged  A chaperone was used for the GU portion of the exam, Sonya West, CMA.   No results found for this or any previous visit (from the past 72 hour(s)). No results found.    Assessment and Plan: 27 y.o. female with   .Sonya West was seen today for menstrual problem.  Diagnoses and all orders for this visit:  Abnormal uterine bleeding -     TSH + free T4 -  CBC -     US PELVIC COMPLETE WITH TRANSVAGINAL -     hCG, serum, qualitative  Dysmenorrhea   - benign exam, history is not consistent with a blood dyscrasia/disorder of hemostasis - given the history of post-coital bleeding, will obtain pelvic/TV ultrasound - hcg, cbc, TSH pending - continue to hold Spironolactone for now - if work-up negative, instructed to follow-up with myself or OB/GYN to discuss OCP/IUD if patient desires treatment   Patient education and anticipatory guidance given Patient agrees with treatment plan Follow-up as needed if symptoms worsen or fail to improve  Sonya Hubert PA-C

## 2018-03-30 LAB — CBC
HCT: 38.8 % (ref 35.0–45.0)
HEMOGLOBIN: 13.6 g/dL (ref 11.7–15.5)
MCH: 29.4 pg (ref 27.0–33.0)
MCHC: 35.1 g/dL (ref 32.0–36.0)
MCV: 83.8 fL (ref 80.0–100.0)
MPV: 11.5 fL (ref 7.5–12.5)
Platelets: 345 10*3/uL (ref 140–400)
RBC: 4.63 10*6/uL (ref 3.80–5.10)
RDW: 12.8 % (ref 11.0–15.0)
WBC: 10.2 10*3/uL (ref 3.8–10.8)

## 2018-03-30 LAB — HCG, SERUM, QUALITATIVE: Preg, Serum: NEGATIVE

## 2018-03-30 LAB — TSH+FREE T4: TSH W/REFLEX TO FT4: 3.33 m[IU]/L

## 2018-04-01 ENCOUNTER — Ambulatory Visit (INDEPENDENT_AMBULATORY_CARE_PROVIDER_SITE_OTHER): Payer: BLUE CROSS/BLUE SHIELD

## 2018-04-01 ENCOUNTER — Encounter: Payer: Self-pay | Admitting: Physician Assistant

## 2018-04-01 DIAGNOSIS — R9389 Abnormal findings on diagnostic imaging of other specified body structures: Secondary | ICD-10-CM | POA: Diagnosis not present

## 2018-04-02 ENCOUNTER — Other Ambulatory Visit: Payer: Self-pay | Admitting: Physician Assistant

## 2018-04-02 DIAGNOSIS — L658 Other specified nonscarring hair loss: Secondary | ICD-10-CM

## 2018-04-04 DIAGNOSIS — N838 Other noninflammatory disorders of ovary, fallopian tube and broad ligament: Secondary | ICD-10-CM | POA: Insufficient documentation

## 2018-04-04 NOTE — Addendum Note (Signed)
Addended by: Gena Fray E on: 04/04/2018 08:40 AM   Modules accepted: Orders

## 2018-04-04 NOTE — Progress Notes (Signed)
Ultrasound showed a left ovarian cyst and slightly thickened uterine lining Referring to OB/GYN for follow-up

## 2018-04-21 ENCOUNTER — Ambulatory Visit: Payer: BLUE CROSS/BLUE SHIELD | Admitting: Osteopathic Medicine

## 2018-04-25 ENCOUNTER — Ambulatory Visit: Payer: BLUE CROSS/BLUE SHIELD | Admitting: Physician Assistant

## 2018-04-25 ENCOUNTER — Ambulatory Visit (INDEPENDENT_AMBULATORY_CARE_PROVIDER_SITE_OTHER): Payer: BLUE CROSS/BLUE SHIELD | Admitting: Obstetrics & Gynecology

## 2018-04-25 ENCOUNTER — Encounter: Payer: Self-pay | Admitting: Obstetrics & Gynecology

## 2018-04-25 VITALS — BP 130/89 | HR 87 | Resp 16 | Ht 62.0 in | Wt 160.0 lb

## 2018-04-25 DIAGNOSIS — N838 Other noninflammatory disorders of ovary, fallopian tube and broad ligament: Secondary | ICD-10-CM | POA: Diagnosis not present

## 2018-04-25 DIAGNOSIS — L649 Androgenic alopecia, unspecified: Secondary | ICD-10-CM | POA: Diagnosis not present

## 2018-04-25 NOTE — Progress Notes (Signed)
   Subjective:    Patient ID: Sonya West, female    DOB: April 05, 1991, 27 y.o.   MRN: [id number]  HPI  Menarche:13 Menses were "nml;" lasting about a week.  Cramps began late middle school and high school.  Pt took ibuprofen/Midol for pain.  Pt did not miss school but felt very bad.  Menses came once per month through high school.  Cramps slightly better now.  Gets cramps a few days before.  Pt now gets irritable, angry several days to a week before menses begins.  Menses was once a month until earlier this year (march 2019); twice patient had two menses in one month (less than 14 days).  Nausea is associated with last menses.    Review of Systems  Constitutional: Negative.   Respiratory: Negative.   Cardiovascular: Negative.   Gastrointestinal: Negative.   Endocrine: Negative.        Objective:   Physical Exam  Constitutional: She is oriented to person, place, and time. She appears well-developed and well-nourished. No distress.  HENT:  Head: Normocephalic and atraumatic.  Eyes: Conjunctivae are normal.  Cardiovascular: Normal rate.  Pulmonary/Chest: Effort normal.  Abdominal: Soft. Bowel sounds are normal. She exhibits no distension and no mass. There is no tenderness. There is no rebound and no guarding.  Genitourinary:  Genitourinary Comments: Tanner V Vagina:  Pink nml rugae Cervix:  Closed, no lesion, no CMT Uterus:  Mobile, small, NT Adnexa:  NT  Musculoskeletal: She exhibits no edema.  Neurological: She is alert and oriented to person, place, and time.  Skin: Skin is warm and dry.  Psychiatric: She has a normal mood and affect.  Vitals reviewed.  Vitals:   04/25/18 1355  BP: 130/89  Pulse: 87  Resp: 16  Weight: 160 lb (72.6 kg)  Height: 5\' 2"  (1.575 m)   Reviewed pictures of [place] with patient and showed her measurements of uterus and bilateral ovaries.  Assessment & Plan:  27 yo female presents for dysmenorrhea, some irregular menses, ovarian cyst on [place], symptoms  of PMDD, and female pattern baldness.  Pt would like dysmenorrhea and PMDD symptoms fixe the most.  Combination OCPS will address all these complaints.  It will also prevent recurrence of ovarian cysts.  The estrogen will increase SHBG and bind testosterone.  Check Testosterone and Hgb A1c levels today.  Rpt [place] in 10 weeks and f/u with me in 3 months  45 minutes was spent with patient face to face and coordinating care.  Education was around hormones, menstrual cycle, ovulation, SHBG, testosterone, PMDD, future fertility, and libido.

## 2018-04-26 LAB — TIQ-NTM

## 2018-04-26 LAB — HEMOGLOBIN A1C
EAG (MMOL/L): 5 (calc)
Hgb A1c MFr Bld: 4.8 % of total Hgb (ref <5.7)
Mean Plasma Glucose: 91 (calc)

## 2018-04-26 LAB — TESTOSTERONE, TOTAL, LC/MS/MS

## 2018-04-26 NOTE — Addendum Note (Signed)
Addended by: Granville Lewis on: 04/26/2018 07:46 AM   Modules accepted: Orders

## 2018-04-27 LAB — TESTOSTERONE: Testosterone: 35 ng/dL

## 2018-05-04 ENCOUNTER — Telehealth: Payer: Self-pay | Admitting: General Practice

## 2018-05-04 NOTE — Telephone Encounter (Signed)
Attempted to return pt's call but she did not pick up.  Left voicemail informing her that I was returning her call and that if she continued to have questions or concerns, she could call the office.

## 2018-05-04 NOTE — Telephone Encounter (Signed)
Patient called & left message on nurse voicemail line stating she saw Dr Penne Lash last week and would like her results.

## 2018-05-25 ENCOUNTER — Telehealth: Payer: Self-pay | Admitting: *Deleted

## 2018-05-25 ENCOUNTER — Ambulatory Visit (INDEPENDENT_AMBULATORY_CARE_PROVIDER_SITE_OTHER): Payer: BLUE CROSS/BLUE SHIELD | Admitting: Physician Assistant

## 2018-05-25 ENCOUNTER — Encounter: Payer: Self-pay | Admitting: Physician Assistant

## 2018-05-25 VITALS — BP 129/85 | HR 65 | Temp 97.8°F | Wt 153.0 lb

## 2018-05-25 DIAGNOSIS — A084 Viral intestinal infection, unspecified: Secondary | ICD-10-CM

## 2018-05-25 LAB — POCT URINALYSIS DIPSTICK
Glucose, UA: NEGATIVE
Ketones, UA: 80
LEUKOCYTES UA: NEGATIVE
Nitrite, UA: NEGATIVE
PH UA: 5.5 (ref 5.0–8.0)
Protein, UA: POSITIVE — AB
RBC UA: NEGATIVE
UROBILINOGEN UA: 1 U/dL

## 2018-05-25 LAB — POCT URINE PREGNANCY: Preg Test, Ur: NEGATIVE

## 2018-05-25 MED ORDER — ONDANSETRON 4 MG PO TBDP: 4.0000 mg | ORAL_TABLET | Freq: Three times a day (TID) | ORAL | 0 refills | Status: DC | PRN

## 2018-05-25 NOTE — Telephone Encounter (Signed)
Left patient a message to call and schedule F/U appt before 07/02/18. Will not have insurance after that date.

## 2018-05-25 NOTE — Progress Notes (Signed)
HPI:                                                                Sonya West is a 27 y.o. female who presents to [facility]: Primary Care Sports Medicine today for GI illness  Symptoms began approx 5 days ago on Saturday evening. She has been having several episodes per day of nausea and vomiting. Endorses some upper abdominal discomfort. She began feeling better on Monday and was able to tolerate small amounts of food (crackers, pretzels) She ate [employer] for lunch on Tuesday and became nauseated afterwards. Last emesis was last night. Denies fever, diarrhea, change in bowel habits, hematemesis, hematochezia/melena, dysuria or hematuria She is able to drink water without vomiting. She has not eaten anything today.  Past Medical History:  Diagnosis Date  . ADHD   . Allergy   . Asthma   . Dysmenorrhea   . Female pattern baldness    Past Surgical History:  Procedure Laterality Date  . WISDOM TOOTH EXTRACTION     Social History   Tobacco Use  . Smoking status: Former Smoker    Types: Cigarettes    Last attempt to quit: 07/02/2012    Years since quitting: 5.8  . Smokeless tobacco: Never Used  . Tobacco comment: smoked <1 year  Substance Use Topics  . Alcohol use: Yes    Comment: 1 drink every few months   family history includes Cancer in her paternal grandfather; Diabetes in her paternal grandfather; Heart attack in her maternal grandfather.    ROS: negative except as noted in the HPI  Medications: Current Outpatient Medications  Medication Sig Dispense Refill  . albuterol (PROVENTIL HFA;VENTOLIN HFA) 108 (90 Base) MCG/ACT inhaler Inhale 2 puffs into the lungs every 6 (six) hours as needed. 30 minutes before activity 1 Inhaler 1  . Biotin 10 MG CAPS Take by mouth.    Marland Kitchen HYDROCORTISONE, TOPICAL, 1 % GEL Apply topically to affected areas twice a day for 1 week  0  . Multiple Vitamins-Minerals (ONE DAILY MULTIVITAMIN ADULT PO) Take by mouth.    .  ondansetron (ZOFRAN-ODT) 4 MG disintegrating tablet Take 1 tablet (4 mg total) by mouth every 8 (eight) hours as needed for nausea or vomiting. 10 tablet 0   No current facility-administered medications for this visit.    Allergies  Allergen Reactions  . Azithromycin Other (See Comments)    Blisters in mouth, ? SJS       Objective:  BP 129/85   Pulse 65   Temp 97.8 F (36.6 C) (Oral)   Wt 153 lb (69.4 kg)   SpO2 98%   BMI 27.98 kg/m  Gen:  alert, not ill-appearing, no distress, appropriate for age HEENT: head normocephalic without obvious abnormality, conjunctiva and cornea clear, no icterus, mucous membranes slightly pale, oropharynx clear, trachea midline Pulm: Normal work of breathing, normal phonation, clear to auscultation bilaterally, no wheezes, rales or rhonchi CV: Normal rate, regular rhythm, s1 and s2 distinct, no murmurs, clicks or rubs  GI: abdomen soft, mild tenderness of epigastric and lower quadrants, no guarding or rigidity, no rebound, no CVA tenderness Neuro: alert and oriented x 3, no tremor MSK: extremities atraumatic, normal gait and station Skin: intact, no rashes on exposed skin,  no jaundice, no cyanosis   Results for orders placed or performed in visit on 05/25/18 (from the past 72 hour(s))  POCT Urinalysis Dipstick     Status: Abnormal   Collection Time: 05/25/18  9:14 AM  Result Value Ref Range   Color, UA dark yellow    Clarity, UA clear    Glucose, UA Negative Negative   Bilirubin, UA small    Ketones, UA 80    Spec Grav, UA >=1.030 (A) 1.010 - 1.025   Blood, UA negative    pH, UA 5.5 5.0 - 8.0   Protein, UA Positive (A) Negative   Urobilinogen, UA 1.0 0.2 or 1.0 E.U./dL   Nitrite, UA negative    Leukocytes, UA Negative Negative   Appearance     Odor    POCT urine pregnancy     Status: Normal   Collection Time: 05/25/18  9:15 AM  Result Value Ref Range   Preg Test, Ur Negative Negative   No results found.    Assessment and  Plan: 27 y.o. female with   .Sonya West was seen today for emesis.  Diagnoses and all orders for this visit:  Viral gastroenteritis -     POCT urine pregnancy -     POCT Urinalysis Dipstick -     ondansetron (ZOFRAN-ODT) 4 MG disintegrating tablet; Take 1 tablet (4 mg total) by mouth every 8 (eight) hours as needed for nausea or vomiting.   Afebrile, no tachycardia, no tachypnea, benign abdominal exam Supportive care with antiemetics and ORS Counseled on slowly advancing diet from liquids to bland foods as tolerated and to avoid fatty/fried foods Instructed to start PPI for gastritis if upper abdominal pain does not improve  Patient education and anticipatory guidance given Patient agrees with treatment plan Follow-up as needed if symptoms worsen or fail to improve  Levonne Hubert PA-C

## 2018-05-25 NOTE — Patient Instructions (Signed)
- drink Pedialyte dilated with water - stick to a clear liquid diet today - slowly advance your diet as tolerated, stick to a bland diet for the next 2-3 days - avoid fast food, fried and fatty foods for the next week - if you are still having stomach pain, take some OTC Omeprazole/Prilosec - probiotic may also help your GI system recover   Bland Diet A bland diet consists of foods that do not have a lot of fat or fiber. Foods without fat or fiber are easier for the body to digest. They are also less likely to irritate your mouth, throat, stomach, and other parts of your gastrointestinal tract. A bland diet is sometimes called a BRAT diet. What is my plan? Your health care provider or dietitian may recommend specific changes to your diet to prevent and treat your symptoms, such as:  Eating small meals often.  Cooking food until it is soft enough to chew easily.  Chewing your food well.  Drinking fluids slowly.  Not eating foods that are very spicy, sour, or fatty.  Not eating citrus fruits, such as oranges and grapefruit.  What do I need to know about this diet?  Eat a variety of foods from the bland diet food list.  Do not follow a bland diet longer than you have to.  Ask your health care provider whether you should take vitamins. What foods can I eat? Grains  Hot cereals, such as cream of wheat. Bread, crackers, or tortillas made from refined white flour. Rice. Vegetables Canned or cooked vegetables. Mashed or boiled potatoes. Fruits Bananas. Applesauce. Other types of cooked or canned fruit with the skin and seeds removed, such as canned peaches or pears. Meats and Other Protein Sources Scrambled eggs. Creamy peanut butter or other nut butters. Lean, well-cooked meats, such as chicken or fish. Tofu. Soups or broths. Dairy Low-fat dairy products, such as milk, cottage cheese, or yogurt. Beverages Water. Herbal tea. Apple juice. Sweets and Desserts Pudding. Custard.  Fruit gelatin. Ice cream. Fats and Oils Mild salad dressings. Canola or olive oil. The items listed above may not be a complete list of allowed foods or beverages. Contact your dietitian for more options. What foods are not recommended? Foods and ingredients that are often not recommended include:  Spicy foods, such as hot sauce or salsa.  Fried foods.  Sour foods, such as pickled or fermented foods.  Raw vegetables or fruits, especially citrus or berries.  Caffeinated drinks.  Alcohol.  Strongly flavored seasonings or condiments.  The items listed above may not be a complete list of foods and beverages that are not allowed. Contact your dietitian for more information. This information is not intended to replace advice given to you by your health care provider. Make sure you discuss any questions you have with your health care provider. Document Released: 11/04/2015 Document Revised: 12/19/2015 Document Reviewed: 07/25/2014 Elsevier Interactive Patient Education  2018 Elsevier Inc.  Clear Liquid Diet A clear liquid diet means that you only have liquids that you can see through. You do not eat any food on this diet. Most people need to follow this diet for only a short time. What do I need to know about this diet?  A clear liquid is a liquid that you can see through when you hold it up to a light.  This diet does not give you all the nutrients that you need. Choose a variety of the liquids that your doctor says you can drink on  this diet. That way, you will get as many nutrients as possible.  If you are not sure whether you can have certain items, ask your doctor. What can I have?  Water and flavored water.  Fruit juices that do not have pulp, such as cranberry juice and apple juice.  Tea and coffee without milk or cream.  Clear bouillon or broth.  Broth-based soups that have been strained.  Flavored gelatins.  Honey.  Sugar water.  Frozen ice or frozen ice pops  that do not have any milk, yogurt, fruit pieces, or fruit pulp in them.  Clear sodas.  Clear sports drinks. The items listed above may not be a complete list of recommended liquids. Contact your food and nutrition expert (dietitian) for more options. What can I not have?  Juices that have pulp.  Milk.  Cream or cream-based soups.  Yogurt. The items listed above may not be a complete list of liquids to avoid. Contact your food and nutrition expert for more information. Summary  A clear liquid diet is a diet that includes only liquids that you can see through.  The goal of this diet is to help you recover.  Make sure to avoid liquids with milk, cream, or pulp while you are on this diet. This information is not intended to replace advice given to you by your health care provider. Make sure you discuss any questions you have with your health care provider. Document Released: 06/25/2008 Document Revised: 02/24/2016 Document Reviewed: 06/09/2013 Elsevier Interactive Patient Education  2017 [employer].

## 2018-06-10 ENCOUNTER — Ambulatory Visit (INDEPENDENT_AMBULATORY_CARE_PROVIDER_SITE_OTHER): Payer: BLUE CROSS/BLUE SHIELD

## 2018-06-10 DIAGNOSIS — N838 Other noninflammatory disorders of ovary, fallopian tube and broad ligament: Secondary | ICD-10-CM | POA: Diagnosis not present

## 2018-06-13 ENCOUNTER — Ambulatory Visit: Payer: BLUE CROSS/BLUE SHIELD | Admitting: Obstetrics & Gynecology

## 2018-06-13 ENCOUNTER — Encounter: Payer: Self-pay | Admitting: Obstetrics & Gynecology

## 2018-06-13 VITALS — BP 119/81 | HR 87 | Ht 62.0 in | Wt 156.0 lb

## 2018-06-13 DIAGNOSIS — N921 Excessive and frequent menstruation with irregular cycle: Secondary | ICD-10-CM

## 2018-06-13 NOTE — Progress Notes (Signed)
   Subjective:    Patient ID: Sonya West, female    DOB: 11/11/1990, 27 y.o.   MRN: [id number]  HPI  27-year-old female presents for follow-up of birth control pills.  Patient is doing well on them.  She has had no dysmenorrhea.  She is having some spotting during this pill pack.  She is on low Loestrin which could lead to the spotting.  The only birth control she could afford at the time and was given free samples.  He can afford up to $9 a month so we will check with her insurance and see another suitable pill with a higher estrogen.  We reviewed her ultrasound which showed resolution of the cyst.  She is very happy about that.  Review of Systems  Constitutional: Negative.   Respiratory: Negative.   Cardiovascular: Negative.   Gastrointestinal: Negative.   Genitourinary: Positive for menstrual problem and vaginal bleeding.  Psychiatric/Behavioral: Negative.        Objective:   Physical Exam  Constitutional: She is oriented to person, place, and time. She appears well-developed and well-nourished. No distress.  HENT:  Head: Normocephalic and atraumatic.  Eyes: Conjunctivae are normal.  Cardiovascular: Normal rate.  Pulmonary/Chest: Effort normal.  Musculoskeletal: She exhibits no edema.  Neurological: She is alert and oriented to person, place, and time.  Skin: Skin is warm and dry.  Psychiatric: She has a normal mood and affect.  Vitals reviewed.  Vitals:   06/13/18 1309  BP: 119/81  Pulse: 87  Weight: 156 lb (70.8 kg)  Height: 5\' 2"  (1.575 m)   Assessment & Plan:  27 year old female presents for follow-up of ovarian cyst and starting oral contraceptives.  Breakthrough bleeding.  Will investigate with other pills her insurance will pay.  Goal is $5 a month or less. Resolution of ovarian cyst. RTC 3 months

## 2018-06-22 ENCOUNTER — Other Ambulatory Visit: Payer: Self-pay | Admitting: Obstetrics & Gynecology

## 2018-06-22 MED ORDER — NORGESTREL-ETHINYL ESTRADIOL 0.3-30 MG-MCG PO TABS: 1.0000 | ORAL_TABLET | Freq: Every day | ORAL | 11 refills | Status: DC

## 2018-06-22 NOTE — Progress Notes (Signed)
Birth control pills sent to [facility].  One year supply Lo Ovral.

## 2018-07-01 ENCOUNTER — Other Ambulatory Visit: Payer: Self-pay | Admitting: Physician Assistant

## 2018-07-01 DIAGNOSIS — J4520 Mild intermittent asthma, uncomplicated: Secondary | ICD-10-CM

## 2018-07-04 ENCOUNTER — Ambulatory Visit (INDEPENDENT_AMBULATORY_CARE_PROVIDER_SITE_OTHER): Payer: BLUE CROSS/BLUE SHIELD | Admitting: Physician Assistant

## 2018-07-04 ENCOUNTER — Encounter: Payer: Self-pay | Admitting: Physician Assistant

## 2018-07-04 VITALS — BP 114/79 | HR 71 | Resp 14 | Wt 156.0 lb

## 2018-07-04 DIAGNOSIS — J45990 Exercise induced bronchospasm: Secondary | ICD-10-CM | POA: Diagnosis not present

## 2018-07-04 NOTE — Progress Notes (Signed)
HPI:                                                                Sonya West is a 27 y.o. female who presents to [facility]: [facility] today for asthma follow-up    Asthma: well controlled. Uses Albuterol 2 puffs prior to exercise most days per week. Denies any exacerbations in the last year. Does not smoke.    She is starting a third shift position at [employer] beginning today. Currently does not have insurance coverage.  Past Medical History:  Diagnosis Date  . ADHD   . Allergy   . Asthma   . Dysmenorrhea   . Female pattern hair loss    Past Surgical History:  Procedure Laterality Date  . WISDOM TOOTH EXTRACTION     Social History   Tobacco Use  . Smoking status: Former Smoker    Types: Cigarettes    Last attempt to quit: 07/02/2012    Years since quitting: 6.0  . Smokeless tobacco: Never Used  . Tobacco comment: smoked <1 year  Substance Use Topics  . Alcohol use: Yes    Comment: 1 drink every few months   family history includes Cancer in her paternal grandfather; Diabetes in her paternal grandfather; Heart attack in her maternal grandfather.    ROS: negative except as noted in the HPI  Medications: Current Outpatient Medications  Medication Sig Dispense Refill  . albuterol (PROVENTIL HFA;VENTOLIN HFA) 108 (90 Base) MCG/ACT inhaler INHALE 2 PUFFS INTO THE LUNGS EVERY 6 (SIX) HOURS AS NEEDED 30 MINUTES BEFORE ACTIVITY 8.5 Inhaler 1  . Biotin 10 MG CAPS Take by mouth.    . Multiple Vitamins-Minerals (ONE DAILY MULTIVITAMIN ADULT PO) Take by mouth.    . norgestrel-ethinyl estradiol (LO/OVRAL,CRYSELLE) 0.3-30 MG-MCG tablet Take 1 tablet by mouth daily. 1 Package 11   No current facility-administered medications for this visit.    Allergies  Allergen Reactions  . Azithromycin Other (See Comments)    Blisters in mouth, ? SJS       Objective:  BP 114/79   Pulse 71   Resp 14   Wt 156 lb (70.8 kg)   LMP 06/12/2018    SpO2 99%   BMI 28.53 kg/m  Gen:  alert, not ill-appearing, no distress, appropriate for age HEENT: head normocephalic without obvious abnormality, conjunctiva and cornea clear, trachea midline Pulm: Normal work of breathing, normal phonation, clear to auscultation bilaterally, no wheezes, rales or rhonchi CV: Normal rate, regular rhythm, s1 and s2 distinct, no murmurs, clicks or rubs  Neuro: alert and oriented x 3, no tremor MSK: extremities atraumatic, normal gait and station Skin: intact, no rashes on exposed skin, no jaundice, no cyanosis Psych: well-groomed, cooperative, good eye contact, euthymic mood, affect mood-congruent, speech is articulate, and thought processes clear and goal-directed    No results found for this or any previous visit (from the past 72 hour(s)). No results found.    Assessment and Plan: 27 y.o. female with   .Sonya West was seen today for asthma.  Diagnoses and all orders for this visit:  Exercise-induced asthma  Exercise-induced bronchospasm  Pulse ox 99% on RA at rest, lungs CTA Well controlled exercise-induced bronchospasm/asthma Cont Albuterol 2 puffs 15-30 mins prior to  exercise Declines influenza Tdap and Pneumovax deferred due to lapse in insurance. Encouraged her to schedule a nurse visit when she has insurance coverage again   Patient education and anticipatory guidance given Patient agrees with treatment plan Follow-up in 6 months for med mgmt as needed if symptoms worsen or fail to improve  Levonne Hubertharley E. Cummings PA-C

## 2018-07-04 NOTE — Patient Instructions (Addendum)
Return when you have insurance for Tdap vaccine to prevent whooping cough    Exercise-Induced Bronchospasm Exercise-induced bronchospasm (EIB) happens when the airways narrow during or after exercise. The airways are the passages that lead from the nose and mouth down into the lungs. When the airways narrow, this can cause coughing, wheezing, and shortness of breath. Anyone can develop this condition, even those who do not have asthma or allergies. To help prevent episodes of EIB, you may need to take medicine or change your workout routine. You should tell your coach, teammates, or workout partners about your condition so they know how to help you if you do have an episode. What are the causes? The exact cause of this condition is not known. Symptoms are brought on (triggered) by physical activity. EIB can also be triggered by dry air or by allergens and irritants, such as the chemicals used in pools and skating rinks. What increases the risk? The following factors may make you more likely to develop this condition:  Having asthma.  Exercising in dry air.  Exercising outdoors during allergy season.  Playing an outdoor sport that requires continuous motion. This includes sports such as soccer, hockey, and cross-country skiing.  What are the signs or symptoms? Symptoms of this condition include:  Wheezing.  Coughing.  Shortness of breath.  Tightness in the chest.  Sore throat.  Upset stomach.  How is this diagnosed? This condition may be diagnosed based on your symptoms, your medical history, and a physical exam. A test may be done to measure how exercise affects your breathing (spirometry test). For this test, you breathe into a device before and after exercising. How is this treated? Treatment for this condition may include:  Changing your exercise routine. You may have to: ? Spend a few minutes warming up before your workout. ? Exercise indoors when the air is dry or  during allergy season.  Taking medicine. Your health care provider may prescribe: ? An inhaler for you to use before you exercise. ? Oral medicine to control allergies and asthma. ? Inhaled steroids.  Follow these instructions at home:  Take over-the-counter and prescription medicines only as told by your health care provider.  Keep all bronchospasm medicine with you during your workout.  Make changes in your workout as told by your health care provider.  Wear a medical ID bracelet. Tell your coach, trainer, or teammates about your condition.  If you are planning to exercise alone or in an isolated area, let someone know where you are going and when you will be back.  Keep all follow-up visits as told by your health care provider. This is important. How is this prevented?  Take medicines to prevent exercise-induced bronchospasm as told by your health care provider. Work with Transport planner to make changes to your workout as needed.  If dry air triggers exercise-induced bronchospasm: ? Exercise indoors during peak allergy season and on days that are dry or cold. ? Try to breathe in warm, moist air by wearing a scarf over your nose and mouth or breathing only through your nose. Contact a health care provider if:  You have coughing, wheezing, or shortness of breath that continues after treatment.  Your coughing wakes you up at night.  You have less endurance than you used to. Get help right away if:  You cannot catch your breath.  You pass out. This information is not intended to replace advice given to you by your health care provider.  Make sure you discuss any questions you have with your health care provider. Document Released: 07/13/2005 Document Revised: 08/01/2015 Document Reviewed: 03/20/2015 Elsevier Interactive Patient Education  2017 [employer]

## 2019-01-03 ENCOUNTER — Encounter: Payer: Self-pay | Admitting: Physician Assistant

## 2019-01-03 ENCOUNTER — Ambulatory Visit (INDEPENDENT_AMBULATORY_CARE_PROVIDER_SITE_OTHER): Payer: Self-pay | Admitting: Physician Assistant

## 2019-01-03 VITALS — BP 125/83 | HR 71 | Temp 98.5°F | Wt 144.0 lb

## 2019-01-03 DIAGNOSIS — G4726 Circadian rhythm sleep disorder, shift work type: Secondary | ICD-10-CM

## 2019-01-03 MED ORDER — TEMAZEPAM 7.5 MG PO CAPS: 7.5000 mg | ORAL_CAPSULE | Freq: Every evening | ORAL | 0 refills | Status: DC | PRN

## 2019-01-03 NOTE — Progress Notes (Signed)
HPI:                                                                Sonya West is a 28 y.o. female who presents to [place]: [place] today for "sleep issues"  For the last 6 months patient has been working third shift beginning at 11:30 PM and ending at 4:30 AM.  Her typical bedtime is 8 AM.  She maintains the sleep schedule 7 days a week even though she only works 5 days/week.  She reports for the last 3 to 4 weeks she has been having sleep difficulties.  Namely waking up prematurely at 12 PM and unable to fall back asleep.  She denies depressed mood or anhedonia.  Denies excessive worry or anxiety.  Denies alcohol use.  She does not nap.  She has tried over-the-counter melatonin and ZzzQuil for several weeks without any improvement.   Depression screen [facility] 2/9 01/03/2019 10/12/2017 12/30/2016  Decreased Interest 0 0 0  Down, Depressed, Hopeless 0 0 0  PHQ - 2 Score 0 0 0  Altered sleeping 1 - -  Tired, decreased energy 0 - -  Change in appetite 0 - -  Feeling bad or failure about yourself  0 - -  Trouble concentrating 0 - -  Moving slowly or fidgety/restless 0 - -  Suicidal thoughts 0 - -  PHQ-9 Score 1 - -  Difficult doing work/chores Not difficult at all - -    GAD 7 : Generalized Anxiety Score 01/03/2019  Nervous, Anxious, on Edge 0  Control/stop worrying 0  Worry too much - different things 0  Trouble relaxing 0  Restless 0  Easily annoyed or irritable 1  Afraid - awful might happen 0  Total GAD 7 Score 1      Past Medical History:  Diagnosis Date  . ADHD   . Allergy   . Asthma   . Dysmenorrhea   . Female pattern hair loss    Past Surgical History:  Procedure Laterality Date  . WISDOM TOOTH EXTRACTION     Social History   Tobacco Use  . Smoking status: Former Smoker    Types: Cigarettes    Last attempt to quit: 07/02/2012    Years since quitting: 6.5  . Smokeless tobacco: Never Used  . Tobacco comment: smoked <1 year   Substance Use Topics  . Alcohol use: Yes    Comment: 1 drink every few months   family history includes Cancer in her paternal grandfather; Diabetes in her paternal grandfather; Heart attack in her maternal grandfather.    ROS: negative except as noted in the HPI  Medications: Current Outpatient Medications  Medication Sig Dispense Refill  . albuterol (PROVENTIL HFA;VENTOLIN HFA) 108 (90 Base) MCG/ACT inhaler INHALE 2 PUFFS INTO THE LUNGS EVERY 6 (SIX) HOURS AS NEEDED 30 MINUTES BEFORE ACTIVITY 8.5 Inhaler 1  . Multiple Vitamins-Minerals (ONE DAILY MULTIVITAMIN ADULT PO) Take by mouth.    . norgestrel-ethinyl estradiol (LO/OVRAL,CRYSELLE) 0.3-30 MG-MCG tablet Take 1 tablet by mouth daily. 1 Package 11  . Biotin 10 MG CAPS Take by mouth.    . temazepam (RESTORIL) 7.5 MG capsule Take 1-2 capsules (7.5-15 mg total) by mouth at bedtime as needed for sleep. [place]  capsule 0   No current facility-administered medications for this visit.    Allergies  Allergen Reactions  . Azithromycin Other (See Comments)    Blisters in mouth, ? SJS       Objective:  BP 125/83   Pulse 71   Temp 98.5 F (36.9 C) (Oral)   Wt 144 lb (65.3 kg)   BMI 26.34 kg/m  Gen:  alert, not ill-appearing, no distress, appropriate for age HEENT: head normocephalic without obvious abnormality, conjunctiva and cornea clear, trachea midline Pulm: Normal work of breathing, normal phonation Neuro: alert and oriented x 3 Psych: cooperative, euthymic mood, affect mood-congruent, speech is articulate, normal rate and volume; thought processes clear and goal-directed, normal judgment, good insight   No results found for this or any previous visit (from the past 72 hour(s)). No results found.    Assessment and Plan: 28 y.o. female with   .Sonya West was seen today for sleeping problem.  Diagnoses and all orders for this visit:  Shift work sleep disorder -     temazepam (RESTORIL) 7.5 MG capsule; Take 1-2 capsules  (7.5-15 mg total) by mouth at bedtime as needed for sleep.   Starting Restoril 7 and half milligrams at bedtime for 3 days.  May titrate to 15 mg.  Take daily at bedtime for 1 week and then daily as needed.  We discussed risks of tolerance and dependence.  Counseled on sleep hygiene    Patient education and anticipatory guidance given Patient agrees with treatment plan Follow-up E-visit in 1 month as needed if symptoms worsen or fail to improve  Levonne Hubertharley E. Cummings PA-C

## 2019-01-03 NOTE — Patient Instructions (Signed)
How can I deal with shift work sleep disorder (SWSD)? Most shift workers sleep 1 to 4 hours less than non-shift workers. It is important to get at least 7 to 8 hours of sleep every day.  Shift workers must be willing to make sleep a priority. People who work shifts other than a 9 a.m. to 5 p.m. routine might have to prepare for sleep even though it might be daylight outside. This can be done in the following ways:  Minimize exposure to light on the way home from night shift work to keep morning sunlight from activating the internal "daytime clock." Follow bedtime rituals and try to keep a regular sleep schedule, even on weekends and days off from work. At home, ask family and friends to help create a quiet, dark and peaceful setting during sleep time. Ask family members to wear headphones to listen to music or watch TV. Encourage people in the household to avoid vacuuming, dish washing, and other noisy activities. Put a "Do Not Disturb" sign on the front door so that delivery people and friends will not knock or ring the doorbell. What can I do to decrease the effects of SWSD? Maintain a sleep diary to help identify the problem and monitor its progression over time. Decrease the number of night shifts worked in a row. Shift workers working the night shift should limit the number of night shifts to 5 or less, with days off in between. Shift workers working 12-hour shifts, should limit work to 4 shifts in a row. After a string of night shifts, take more than 48 hours off, if possible. Avoid extended work hours. Avoid working prolonged shifts and putting in excessive overtime. Make sure to have time to sleep and participate in family and social activities. Avoid long commutes, which can take time away from sleeping. Avoid frequently rotating shifts. It is more difficult to deal with rotating shifts than it is to work the same shift for a longer period of time. Get enough sleep on days off. Practice  good sleep hygiene by planning and arranging a sleep schedule and by avoiding caffeine, alcohol, and nicotine. Do not start a night shift with sleep deprivation. Plan a nap before or during the night shift. Naps can improve alertness in night shift workers. Caffeine and prescription wake-promoting drugs such as modafinil (Provigil) or armodafinil (Nuvigil) have some role in promoting wakefulness during work hours. But the best strategy is to get adequate sleep. Sleep aids at times can be prescribed if difficulty sleeping persists despite following the above measures. Appropriate light exposure during the early part of the shift can improve alertness during the shift.

## 2019-01-31 ENCOUNTER — Ambulatory Visit (INDEPENDENT_AMBULATORY_CARE_PROVIDER_SITE_OTHER): Payer: Self-pay | Admitting: Physician Assistant

## 2019-01-31 ENCOUNTER — Encounter: Payer: Self-pay | Admitting: Physician Assistant

## 2019-01-31 DIAGNOSIS — G4726 Circadian rhythm sleep disorder, shift work type: Secondary | ICD-10-CM

## 2019-01-31 MED ORDER — TEMAZEPAM 15 MG PO CAPS: 15.0000 mg | ORAL_CAPSULE | Freq: Every evening | ORAL | 2 refills | Status: DC | PRN

## 2019-01-31 NOTE — Progress Notes (Signed)
Virtual Visit via Telephone Note  I connected with Sonya West on 01/31/19 at  8:10 AM EDT by telephone and verified that I am speaking with the correct person using two identifiers.   I discussed the limitations, risks, security and privacy concerns of performing an evaluation and management service by telephone and the availability of in person appointments. I also discussed with the patient that there may be a patient responsible charge related to this service. The patient expressed understanding and agreed to proceed.   History of Present Illness: HPI:                                                                Sonya West is a 28 y.o. female   CC: sleep follow-up    For the last 6 months patient has been working third shift beginning at 11:30 PM and ending at 4:30 AM.  Her typical bedtime is 8 AM.  She maintains the sleep schedule 7 days a week even though she only works 5 days/week.  She reports for the last 3 to 4 weeks she has been having sleep difficulties.  Namely waking up prematurely at 12 PM and unable to fall back asleep.  She denies depressed mood or anhedonia.  Denies excessive worry or anxiety.  Denies alcohol use.  She does not nap.  She has tried over-the-counter melatonin and ZzzQuil for several weeks without any improvement. Interval hx 01/31/19 - She reports Temazepam 15 mg has been helping her to sleep about 6 hours without any awakenings. She is taking the medication on work days, approx 5 days per week. Denies any excess sedation/hypersomnia or adverse effects. She did not see any improvement in sleep with 7.5 mg dose.   Reports she had a migraine last night. She typically has 1 migraine every 1-2 months and takes Ibuprofen 400 mg with moderate relief. She typically will need to lay in a dark room and rest to get the headache to completely resolve.  Depression screen [facility] 2/9 01/03/2019 10/12/2017 12/30/2016  Decreased Interest 0 0 0  Down, Depressed, Hopeless 0 0 0  PHQ -  2 Score 0 0 0  Altered sleeping 1 - -  Tired, decreased energy 0 - -  Change in appetite 0 - -  Feeling bad or failure about yourself  0 - -  Trouble concentrating 0 - -  Moving slowly or fidgety/restless 0 - -  Suicidal thoughts 0 - -  PHQ-9 Score 1 - -  Difficult doing work/chores Not difficult at all - -    GAD 7 : Generalized Anxiety Score 01/03/2019  Nervous, Anxious, on Edge 0  Control/stop worrying 0  Worry too much - different things 0  Trouble relaxing 0  Restless 0  Easily annoyed or irritable 1  Afraid - awful might happen 0  Total GAD 7 Score 1      Past Medical History:  Diagnosis Date  . ADHD   . Allergy   . Asthma   . Dysmenorrhea   . Female pattern hair loss    Past Surgical History:  Procedure Laterality Date  . WISDOM TOOTH EXTRACTION     Social History   Tobacco Use  . Smoking status: Former Smoker    Types: Cigarettes  Quit date: 07/02/2012    Years since quitting: 6.5  . Smokeless tobacco: Never Used  . Tobacco comment: smoked <1 year  Substance Use Topics  . Alcohol use: Yes    Comment: 1 drink every few months   family history includes Cancer in her paternal grandfather; Diabetes in her paternal grandfather; Heart attack in her maternal grandfather.    ROS: negative except as noted in the HPI  Medications: Current Outpatient Medications  Medication Sig Dispense Refill  . albuterol (PROVENTIL HFA;VENTOLIN HFA) 108 (90 Base) MCG/ACT inhaler INHALE 2 PUFFS INTO THE LUNGS EVERY 6 (SIX) HOURS AS NEEDED 30 MINUTES BEFORE ACTIVITY 8.5 Inhaler 1  . Multiple Vitamins-Minerals (ONE DAILY MULTIVITAMIN ADULT PO) Take by mouth.    . norgestrel-ethinyl estradiol (LO/OVRAL,CRYSELLE) 0.3-30 MG-MCG tablet Take 1 tablet by mouth daily. 1 Package 11  . temazepam (RESTORIL) 7.5 MG capsule Take 1-2 capsules (7.5-15 mg total) by mouth at bedtime as needed for sleep. 45 capsule 0   No current facility-administered medications for this visit.     Allergies  Allergen Reactions  . Azithromycin Other (See Comments)    Blisters in mouth, ? SJS       Objective:  There were no vitals taken for this visit. Neuro: alert and oriented x 3 Psych: cooperative, euthymic mood, affect mood-congruent, speech is articulate, normal rate and volume; thought processes clear and goal-directed, normal judgment, good insight   No results found for this or any previous visit (from the past 72 hour(s)). No results found.    Assessment and Plan: 28 y.o. female with   .Sonya West was seen today for follow-up.  Diagnoses and all orders for this visit:  Shift work sleep disorder -     temazepam (RESTORIL) 15 MG capsule; Take 1 capsule (15 mg total) by mouth at bedtime as needed for sleep.   Good response to Restoril Cont 15 mg at bedtime prn for shift work Follow-up in 3 months or sooner as needed  Follow Up Instructions:    I discussed the assessment and treatment plan with the patient. The patient was provided an opportunity to ask questions and all were answered. The patient agreed with the plan and demonstrated an understanding of the instructions.   The patient was advised to call back or seek an in-person evaluation if the symptoms worsen or if the condition fails to improve as anticipated.  I provided 5-10 minutes of non-face-to-face time during this encounter.   Carlis Stableharley Elizabeth Cummings, [place]PA-C

## 2019-03-28 ENCOUNTER — Other Ambulatory Visit: Payer: Self-pay | Admitting: Physician Assistant

## 2019-03-28 DIAGNOSIS — J4520 Mild intermittent asthma, uncomplicated: Secondary | ICD-10-CM

## 2019-05-17 ENCOUNTER — Telehealth: Payer: Self-pay | Admitting: Family Medicine

## 2019-05-17 NOTE — Telephone Encounter (Signed)
Left VM for Pt to return clinic call.  

## 2019-05-17 NOTE — Telephone Encounter (Signed)
I received a fax from [facility]. They are doing their job and noticed that you have been filling a lot of albuterol rescue inhalers without any controller medication.  It is okay to use albuterol before exercise to prevent wheezing.  However if you are headed to use albuterol because of wheezing outside of exercise and are using it fairly frequently we need to add a controller medication that you use regularly to reduce lung inflammation.    Please let me know how you are using your albuterol inhaler and if you are having to use it because of wheezing outside of exercise.  If that is the case we need to add a medicine to prevent lung inflammation.

## 2019-05-23 ENCOUNTER — Encounter: Payer: Self-pay | Admitting: Physician Assistant

## 2019-05-23 ENCOUNTER — Ambulatory Visit (INDEPENDENT_AMBULATORY_CARE_PROVIDER_SITE_OTHER): Payer: Self-pay | Admitting: Physician Assistant

## 2019-05-23 VITALS — Temp 97.7°F | Ht 62.0 in | Wt 135.0 lb

## 2019-05-23 DIAGNOSIS — R05 Cough: Secondary | ICD-10-CM

## 2019-05-23 DIAGNOSIS — R5383 Other fatigue: Secondary | ICD-10-CM

## 2019-05-23 DIAGNOSIS — J3489 Other specified disorders of nose and nasal sinuses: Secondary | ICD-10-CM

## 2019-05-23 DIAGNOSIS — R059 Cough, unspecified: Secondary | ICD-10-CM

## 2019-05-23 DIAGNOSIS — R0789 Other chest pain: Secondary | ICD-10-CM

## 2019-05-23 DIAGNOSIS — J029 Acute pharyngitis, unspecified: Secondary | ICD-10-CM

## 2019-05-23 MED ORDER — AMOXICILLIN-POT CLAVULANATE 875-125 MG PO TABS: 1.0000 | ORAL_TABLET | Freq: Two times a day (BID) | ORAL | 0 refills | Status: DC

## 2019-05-23 MED ORDER — PREDNISONE 50 MG PO TABS: ORAL_TABLET | ORAL | 0 refills | Status: DC

## 2019-05-23 NOTE — Progress Notes (Deleted)
Since Saturday evening: Cough Chest tightness Runny nose Sore throat Head pressure Ear pressure  Was feeling better, but woke up this morning with symptoms again.   Has tried ibuprofen, sudafed, and nyquil.  No exposure to Covid that she is aware of.

## 2019-05-23 NOTE — Progress Notes (Signed)
Patient ID: Sonya West, female   DOB: February 11, 1991, 28 y.o.   MRN: [id number] .Marland KitchenVirtual Visit via Video Note  I connected with Sonya West on 05/23/19 at  3:40 PM EDT by a video enabled telemedicine application and verified that I am speaking with the correct person using two identifiers.  Location: Patient: home Provider: clinic   I discussed the limitations of evaluation and management by telemedicine and the availability of in person appointments. The patient expressed understanding and agreed to proceed.  History of Present Illness: Pt is a 28 yo female with hx of exercise induced bronchospasm URI symptoms that started Saturday. She thought she had a cold and treated them with ibuprofen, sudafed, and nyquil. She was feeling a little better and this morning woke up much worse. She is now having a more productive cough, chest tightness, trouble breathing. No other sick contacts. Her face hurts and has a lot of sinus pressure. No known Covid exposure. No sick contacts.   .. Active Ambulatory Problems    Diagnosis Date Noted  . Asthma 12/15/2007  . Attention deficit disorder 12/15/2007  . Dysmenorrhea 12/15/2007  . Tachycardia with heart rate 100-120 beats per minute 12/30/2016  . 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 Lip lesion 09/20/2017  . Acute non-recurrent maxillary sinusitis 11/09/2017  . Abnormal uterine bleeding 03/29/2018  . Left tubo-ovarian mass 04/04/2018  . 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 Tonsillopharyngitis 12/30/2016   Past Medical History:  Diagnosis Date  . ADHD   . Allergy       Observations/Objective: No acute distress. Productive cough on exam.  Flushed cheeks. At times she sounds winded in conversation.  No wheezing.  Increased respirations.   .. Today's Vitals   05/23/19 1346  Temp: 97.7 F (36.5 C)  TempSrc: Oral  Weight: 135 lb (61.2 kg)  Height: 5\' 2"  (1.575 m)   Body  mass index is 24.69 kg/m.    Assessment and Plan: Marland KitchenMarland KitchenAmy was seen today for sore throat.  Diagnoses and all orders for this visit:  Cough -     amoxicillin-clavulanate (AUGMENTIN) 875-125 MG tablet; Take 1 tablet by mouth 2 (two) times daily. For 10 days.  Chest tightness -     predniSONE (DELTASONE) 50 MG tablet; One tab PO daily for 5 days.  Rhinorrhea  Sore throat  Sinus pressure -     amoxicillin-clavulanate (AUGMENTIN) 875-125 MG tablet; Take 1 tablet by mouth 2 (two) times daily. For 10 days. -     predniSONE (DELTASONE) 50 MG tablet; One tab PO daily for 5 days.  No energy   Unclear etiology but symptoms are consistent with COVID. Pt needs to be tested. Self isolate until results received. Start augmentin/prednisone for secondary sickening with sinus infection. Use albuterol as needed for sOB/chest tightness if symptoms worsening or not improving go to ED. Follow up as needed.    Follow Up Instructions:    I discussed the assessment and treatment plan with the patient. The patient was provided an opportunity to ask questions and all were answered. The patient agreed with the plan and demonstrated an understanding of the instructions.   The patient was advised to call back or seek an in-person evaluation if the symptoms worsen or if the condition fails to improve as anticipated.     Iran Planas, PA-C

## 2019-05-24 ENCOUNTER — Other Ambulatory Visit: Payer: Self-pay

## 2019-05-24 DIAGNOSIS — Z20822 Contact with and (suspected) exposure to covid-19: Secondary | ICD-10-CM

## 2019-05-25 LAB — NOVEL CORONAVIRUS, NAA: SARS-CoV-2, NAA: NOT DETECTED

## 2019-06-15 ENCOUNTER — Telehealth: Payer: Self-pay

## 2019-06-15 NOTE — Telephone Encounter (Signed)
Sure, feel free to draft a letter and I can sign off

## 2019-06-15 NOTE — Telephone Encounter (Signed)
Pt left a vm msg requesting a doctor's note for work. As per pt, she needs the letter to state she can wear her FitBit to monitor her heart rate and pulse. Pt stated she has history of exercised induced bronchial spasms. Pls advise, thanks.

## 2019-06-15 NOTE — Telephone Encounter (Signed)
Task completed. Letter placed in provider's box pending signature. Thanks.

## 2019-06-21 ENCOUNTER — Other Ambulatory Visit: Payer: Self-pay | Admitting: *Deleted

## 2019-06-21 DIAGNOSIS — R0789 Other chest pain: Secondary | ICD-10-CM

## 2019-06-21 DIAGNOSIS — J3489 Other specified disorders of nose and nasal sinuses: Secondary | ICD-10-CM

## 2019-06-21 MED ORDER — NORGESTREL-ETHINYL ESTRADIOL 0.3-30 MG-MCG PO TABS: 1.0000 | ORAL_TABLET | Freq: Every day | ORAL | 1 refills | Status: DC

## 2019-06-21 NOTE — Telephone Encounter (Signed)
RF on OCP's given as pt is due for her annual.  Last annual 11/19.

## 2019-06-24 ENCOUNTER — Other Ambulatory Visit: Payer: Self-pay

## 2019-06-24 ENCOUNTER — Encounter: Payer: Self-pay | Admitting: Emergency Medicine

## 2019-06-24 ENCOUNTER — Emergency Department
Admission: EM | Admit: 2019-06-24 | Discharge: 2019-06-24 | Disposition: A | Discharge status: Home / Self Care | Payer: HRSA Program | Source: Home / Self Care

## 2019-06-24 DIAGNOSIS — R05 Cough: Secondary | ICD-10-CM | POA: Diagnosis not present

## 2019-06-24 DIAGNOSIS — U071 COVID-19: Secondary | ICD-10-CM

## 2019-06-24 LAB — POC SARS CORONAVIRUS 2 AG -  ED: SARS Coronavirus 2 Ag: POSITIVE — AB

## 2019-06-24 NOTE — ED Triage Notes (Signed)
Patient presents to [facility] with C/O cough since Tuesday. She states that her husband  Tested positive for Covid today.

## 2019-06-24 NOTE — Discharge Instructions (Addendum)
Your COVID test is positive - it is important to quarantine / isolate at home. If you test positive and would like further evaluation for persistent or worsening symptoms, you may schedule an E-visit or virtual (video) visit throughout the [facility] app or website.  PLEASE NOTE: If you develop severe chest pain or shortness of breath please go to the ER or call 9-1-1 for further evaluation --> DO NOT schedule electronic or virtual visits for this. Please call our office for further guidance / recommendations as needed.

## 2019-06-24 NOTE — ED Provider Notes (Signed)
[facility]    CSN: [id number] Arrival date & time: 06/24/19  1108      History   Chief Complaint Chief Complaint  Patient presents with  . Cough    HPI Sonya West is a 28 y.o. female with history of asthma, allergies presenting for cough since Tuesday.  States her husband tested positive for Covid today and she has been in regular close proximity to him throughout his course of symptoms.  Patient has not tried thing for her symptoms.  Denies chest pain, fever, shortness of breath.  Past Medical History:  Diagnosis Date  . ADHD   . Allergy   . Asthma   . Dysmenorrhea   . Female pattern hair loss     Patient Active Problem List   Diagnosis Date Noted  . 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 Left tubo-ovarian mass 04/04/2018  . Abnormal uterine bleeding 03/29/2018  . Acute non-recurrent maxillary sinusitis 11/09/2017  . Lip lesion 09/20/2017  . 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 Tachycardia with heart rate 100-120 beats per minute 12/30/2016  . Asthma 12/15/2007  . Attention deficit disorder 12/15/2007  . Dysmenorrhea 12/15/2007    Past Surgical History:  Procedure Laterality Date  . WISDOM TOOTH EXTRACTION      OB History    Gravida  0   Para  0   Term  0   Preterm  0   AB  0   Living  0     SAB  0   TAB  0   Ectopic  0   Multiple  0   Live Births  0            Home Medications    Prior to Admission medications   Medication Sig Start Date End Date Taking? Authorizing Provider  albuterol (VENTOLIN HFA) 108 (90 Base) MCG/ACT inhaler INHALE 2 PUFFS INTO THE LUNGS EVERY 6 (SIX) HOURS AS NEEDED 30 MINUTES BEFORE ACTIVITY 03/28/19   Carlis Stableummings, Charley Elizabeth, PA-C  amoxicillin-clavulanate (AUGMENTIN) 875-125 MG tablet Take 1 tablet by mouth 2 (two) times daily. For 10 days. 05/23/19   Breeback, Jade L, PA-C  ibuprofen (ADVIL) 200 MG tablet Take 400 mg by mouth every 6 (six) hours as needed for headache.    [provider]  Multiple Vitamins-Minerals (ONE DAILY MULTIVITAMIN ADULT PO) Take by mouth.    [provider]  norgestrel-ethinyl estradiol (LO/OVRAL) 0.3-30 MG-MCG tablet Take 1 tablet by mouth daily. Pt is due for annual before these RF run out 06/21/19   Lesly DukesLeggett, Kelly H, MD  predniSONE (DELTASONE) 50 MG tablet One tab PO daily for 5 days. 05/23/19   Breeback, Jade L, PA-C  temazepam (RESTORIL) 15 MG capsule Take 1 capsule (15 mg total) by mouth at bedtime as needed for sleep. 01/31/19   Carlis Stableummings, Charley Elizabeth, PA-C    Family History Family History  Problem Relation Age of Onset  . Heart attack Maternal Grandfather   . Diabetes Paternal Grandfather   . Cancer Paternal Grandfather        prostate    Social History Social History   Tobacco Use  . Smoking status: Former Smoker    Types: Cigarettes    Quit date: 07/02/2012    Years since quitting: 6.9  . Smokeless tobacco: Never Used  . Tobacco comment: smoked <1 year  Substance Use Topics  . Alcohol use: Yes    Comment: 1 drink every few months  . Drug use: No  Allergies   Azithromycin   Review of Systems Review of Systems  Constitutional: Negative for fatigue and fever.  HENT: Negative for ear pain, sinus pain, sore throat and voice change.   Eyes: Negative for pain, redness and visual disturbance.  Respiratory: Positive for cough. Negative for shortness of breath and wheezing.   Cardiovascular: Negative for chest pain and palpitations.  Gastrointestinal: Negative for abdominal pain, diarrhea and vomiting.  Musculoskeletal: Negative for arthralgias and myalgias.  Skin: Negative for rash and wound.  Neurological: Negative for syncope and headaches.     Physical Exam Triage Vital Signs ED Triage Vitals  Enc Vitals Group     BP 06/24/19 1133 132/86     Pulse --      Resp 06/24/19 1133 16     Temp 06/24/19 1133 98 F (36.7 C)     Temp src --      SpO2 06/24/19 1133 99 %     Weight 06/24/19  1135 134 lb (60.8 kg)     Height 06/24/19 1135 5\' 2"  (1.575 m)     Head Circumference --      Peak Flow --      Pain Score 06/24/19 1134 0     Pain Loc --      Pain Edu? --      Excl. in [place]? --    No data found.  Updated Vital Signs BP 132/86 (BP Location: Left Arm)   Temp 98 F (36.7 C)   Resp 16   Ht 5\' 2"  (1.575 m)   Wt 134 lb (60.8 kg)   LMP 06/19/2019   SpO2 99%   BMI 24.51 kg/m   Visual Acuity Right Eye Distance:   Left Eye Distance:   Bilateral Distance:    Right Eye Near:   Left Eye Near:    Bilateral Near:     Physical Exam Constitutional:      General: She is not in acute distress.    Appearance: She is normal weight. She is not toxic-appearing.  HENT:     Head: Normocephalic and atraumatic.  Eyes:     General: No scleral icterus.    Pupils: Pupils are equal, round, and reactive to light.  Cardiovascular:     Rate and Rhythm: Normal rate.  Pulmonary:     Effort: Pulmonary effort is normal. No respiratory distress.     Breath sounds: No wheezing.  Skin:    Coloration: Skin is not jaundiced or pale.  Neurological:     Mental Status: She is alert and oriented to person, place, and time.      UC Treatments / Results  Labs (all labs ordered are listed, but only abnormal results are displayed) Labs Reviewed  POC SARS CORONAVIRUS 2 AG -  ED - Abnormal; Notable for the following components:      Result Value   SARS Coronavirus 2 Ag Positive (*)    All other components within normal limits    EKG   Radiology No results found.  Procedures Procedures (including critical care time)  Medications Ordered in UC Medications - No data to display  Initial Impression / Assessment and Plan / UC Course  I have reviewed the triage vital signs and the nursing notes.  Pertinent labs & imaging results that were available during my care of the patient were reviewed by me and considered in my medical decision making (see chart for details).     PC  coated in office, reviewed by me:  Positive.  Patient to quarantine the next 14 days, treat supportively.  Return precautions discussed, patient verbalized understanding and is agreeable to plan. Final Clinical Impressions(s) / UC Diagnoses   Final diagnoses:  COVID-19 virus infection     Discharge Instructions     Your COVID test is pending - it is important to quarantine / isolate at home until your results are back. If you test positive and would like further evaluation for persistent or worsening symptoms, you may schedule an E-visit or virtual (video) visit throughout the [facility] app or website.  PLEASE NOTE: If you develop severe chest pain or shortness of breath please go to the ER or call 9-1-1 for further evaluation --> DO NOT schedule electronic or virtual visits for this. Please call our office for further guidance / recommendations as needed.    ED Prescriptions    None     PDMP not reviewed this encounter.   Odette Fraction [place], [place] 06/26/19 1950

## 2019-07-12 ENCOUNTER — Other Ambulatory Visit: Payer: Self-pay | Admitting: General Practice

## 2019-07-12 MED ORDER — NORGESTREL-ETHINYL ESTRADIOL 0.3-30 MG-MCG PO TABS: 1.0000 | ORAL_TABLET | Freq: Every day | ORAL | 1 refills | Status: DC

## 2019-07-12 NOTE — Addendum Note (Signed)
Addended by: Asencion Islam on: 07/12/2019 01:06 PM   Modules accepted: Orders

## 2019-07-12 NOTE — Telephone Encounter (Signed)
Received fax from [facility], patient is requesting refill on [place] OCPs.

## 2019-08-05 ENCOUNTER — Other Ambulatory Visit: Payer: Self-pay | Admitting: Obstetrics & Gynecology

## 2019-08-17 ENCOUNTER — Telehealth: Payer: Self-pay

## 2019-08-17 MED ORDER — NORGESTREL-ETHINYL ESTRADIOL 0.3-30 MG-MCG PO TABS: 1.0000 | ORAL_TABLET | Freq: Every day | ORAL | 0 refills | Status: DC

## 2019-08-17 NOTE — Telephone Encounter (Signed)
Pt called needing refill of OCP. Pt is aware we will give her one refill but she needs annual appt. Pt scheduled annual appt for 08/28/19.

## 2019-08-28 ENCOUNTER — Encounter: Payer: Self-pay | Admitting: Obstetrics & Gynecology

## 2019-08-28 ENCOUNTER — Ambulatory Visit (INDEPENDENT_AMBULATORY_CARE_PROVIDER_SITE_OTHER): Payer: Self-pay | Admitting: Obstetrics & Gynecology

## 2019-08-28 ENCOUNTER — Other Ambulatory Visit: Payer: Self-pay

## 2019-08-28 VITALS — BP 117/76 | HR 72 | Temp 98.7°F | Ht 62.0 in | Wt 133.0 lb

## 2019-08-28 DIAGNOSIS — Z01419 Encounter for gynecological examination (general) (routine) without abnormal findings: Secondary | ICD-10-CM

## 2019-08-28 MED ORDER — NORGESTREL-ETHINYL ESTRADIOL 0.3-30 MG-MCG PO TABS: 1.0000 | ORAL_TABLET | Freq: Every day | ORAL | 13 refills | Status: DC

## 2019-08-28 NOTE — Progress Notes (Signed)
Subjective:     Sonya West is a 29 y.o. female here for a routine exam.  Current complaints: none.  Had Cvoid in Dec 2020; difficult to wear mask due to asthma.  Happy with birth control pills    Gynecologic History Patient's last menstrual period was 08/14/2019. Contraception: OCP (estrogen/progesterone) Last Pap: 2019. Results were: normal Last mammogram: n/a.  Obstetric History OB History  Gravida Para Term Preterm AB Living  0 0 0 0 0 0  SAB TAB Ectopic Multiple Live Births  0 0 0 0 0     The following portions of the patient's history were reviewed and updated as appropriate: allergies, current medications, past family history, past medical history, past social history, past surgical history and problem list.  Review of Systems Pertinent items noted in HPI and remainder of comprehensive ROS otherwise negative.    Objective:      Vitals:   08/28/19 0858  BP: 117/76  Pulse: 72  Temp: 98.7 F (37.1 C)  Weight: 133 lb (60.3 kg)  Height: 5\' 2"  (1.575 m)   Vitals:  WNL General appearance: alert, cooperative and no distress  HEENT: Normocephalic, without obvious abnormality, atraumatic Eyes: negative Throat: lips, mucosa, and tongue normal; teeth and gums normal  Respiratory: Clear to auscultation bilaterally  CV: Regular rate and rhythm  Breasts:  Normal appearance, no masses or tenderness, no nipple retraction or dimpling  GI: Soft, non-tender; bowel sounds normal; no masses,  no organomegaly  GU: External Genitalia:  Tanner V, no lesion Urethra:  No prolapse   Vagina: Pink, normal rugae, no blood or discharge  Cervix: No CMT, no lesion  Uterus:  Normal size and contour, non tender  Adnexa: Normal, no masses, non tender  Musculoskeletal: No edema, redness or tenderness in the calves or thighs  Skin: No lesions or rash  Lymphatic: Axillary adenopathy: none     Psychiatric: Normal mood and behavior      Assessment:    Healthy female exam.    Plan:   1.   Pap due 2020 2.  Refill OCPs 3.  Anticipatory guidance for Covid vaccine

## 2019-11-18 ENCOUNTER — Other Ambulatory Visit: Payer: Self-pay | Admitting: Physician Assistant

## 2019-11-18 DIAGNOSIS — G4726 Circadian rhythm sleep disorder, shift work type: Secondary | ICD-10-CM

## 2019-11-25 IMAGING — US US PELVIS COMPLETE TRANSABD/TRANSVAG
1 series · 13 of 25 positions shown · non-contrast
Comparison: None

CLINICAL DATA: Intermittent spotting, post coital bleeding

EXAM:
TRANSABDOMINAL AND TRANSVAGINAL ULTRASOUND OF PELVIS
TECHNIQUE: Both transabdominal and transvaginal ultrasound examinations of the
pelvis were performed. Transabdominal technique was performed for
global imaging of the pelvis including uterus, ovaries, adnexal
regions, and pelvic cul-de-sac. It was necessary to proceed with
endovaginal exam following the transabdominal exam to visualize the
adnexa and ovaries.

[Series 1: us pelvis complete transabd/transvag · 0.16mm/px · 13 of 80 slices shown]
[im 1/80]
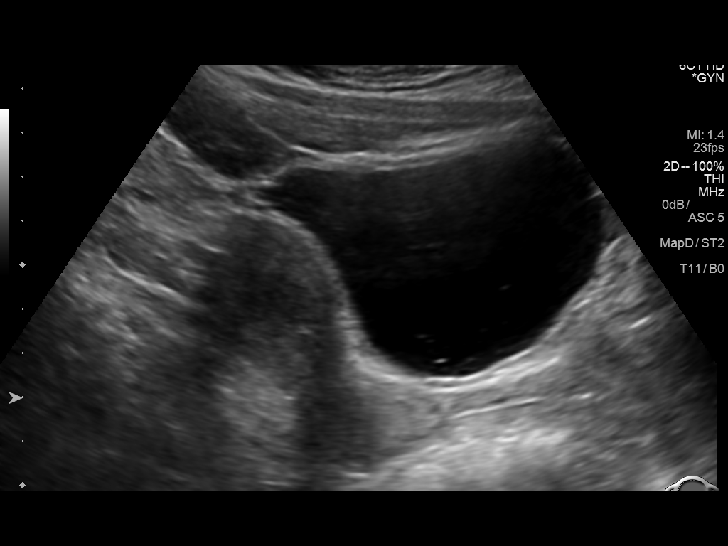
[im 7/80]
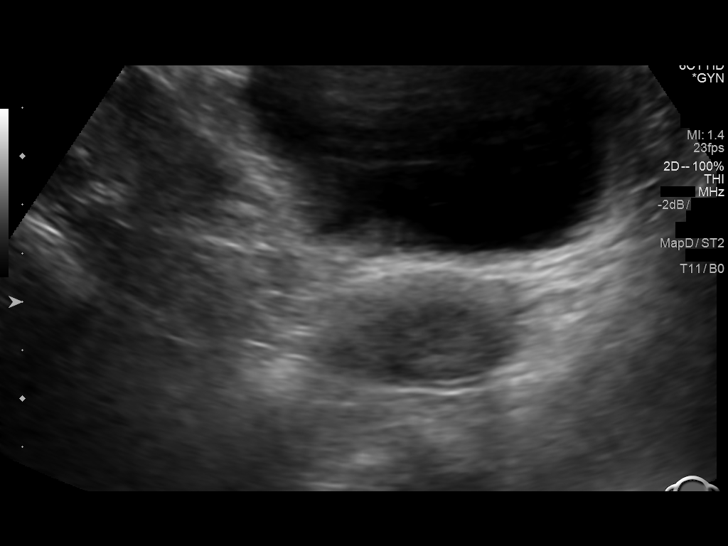
[im 14/80]
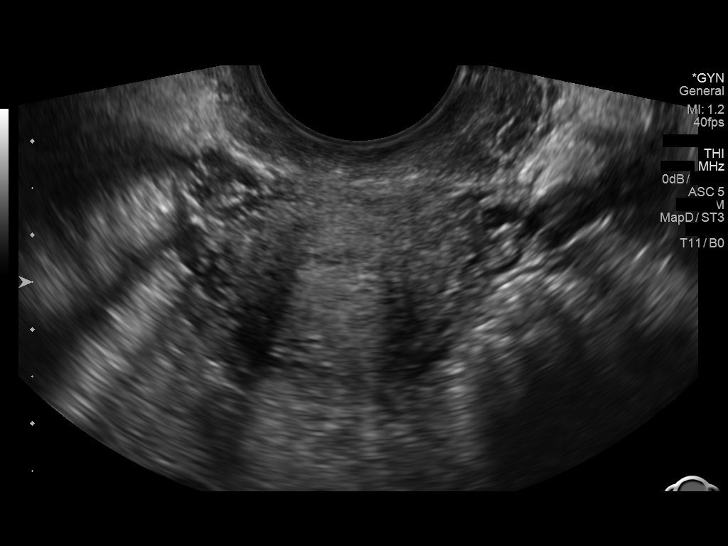
[im 20/80]
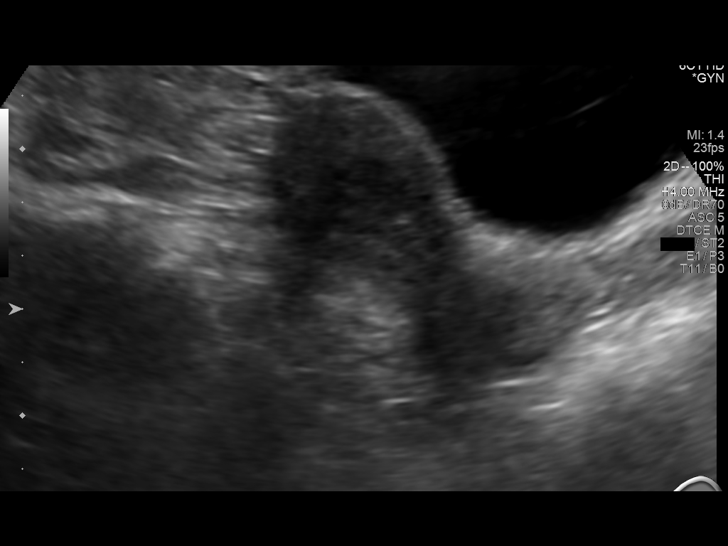
[im 27/80]
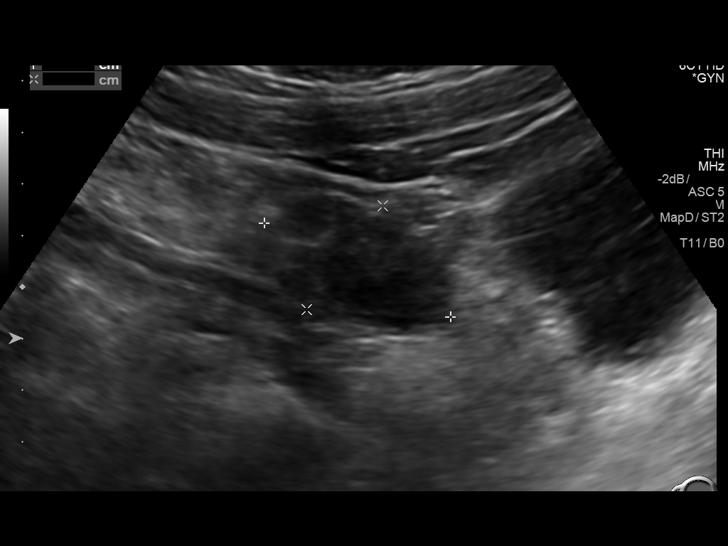
[im 33/80]
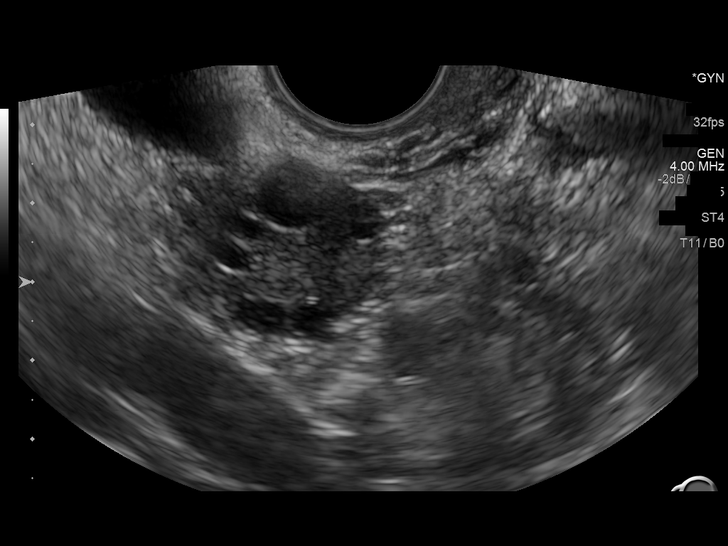
[im 40/80]
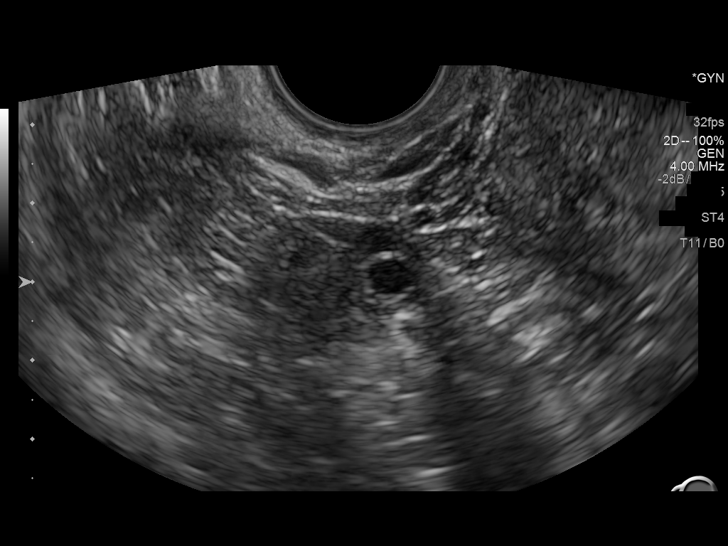
[im 47/80]
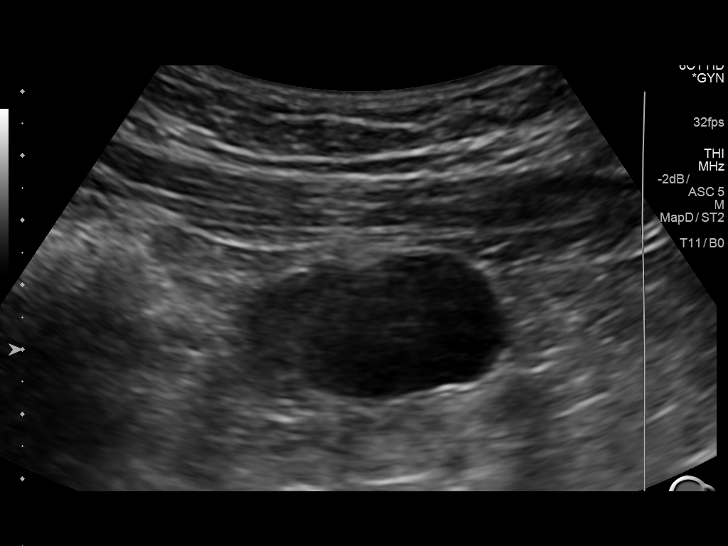
[im 53/80]
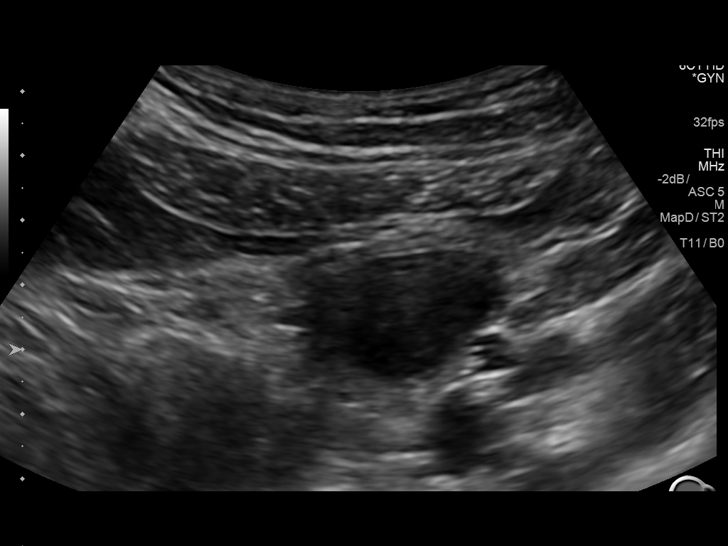
[im 60/80]
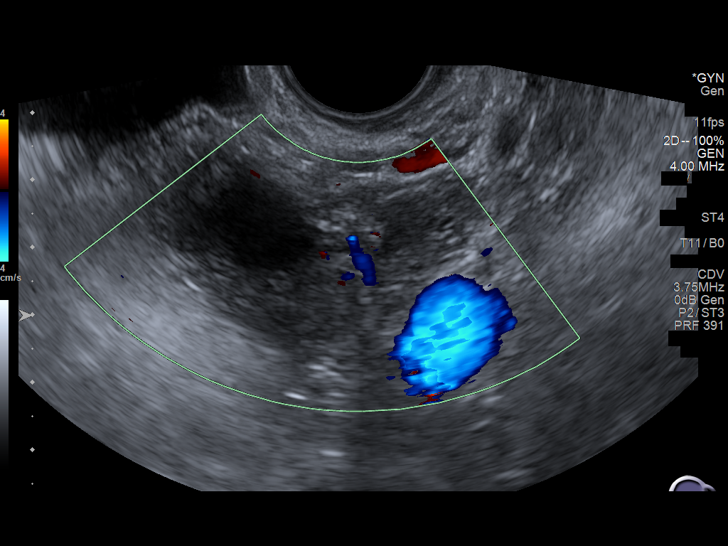
[im 66/80]
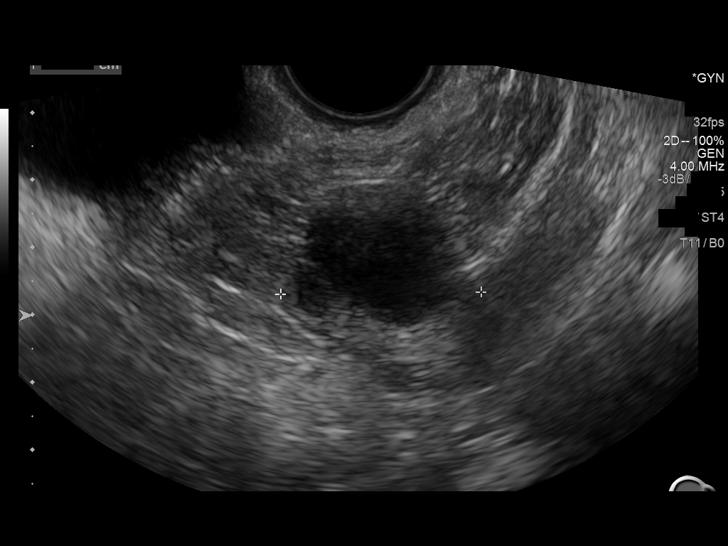
[im 73/80]
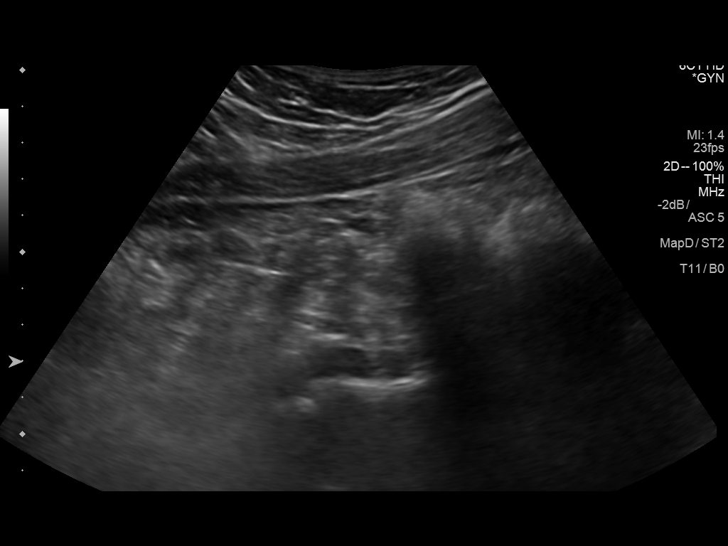
[im 80/80]
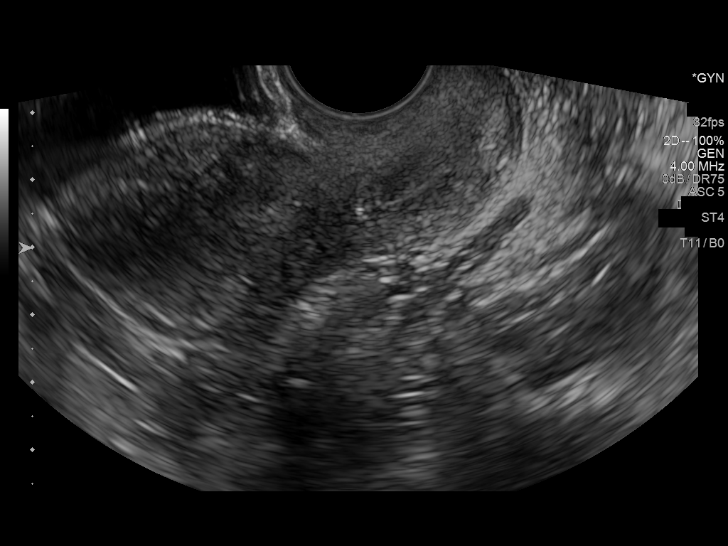

[13 of 25 positions shown; findings below may reference images not displayed]

FINDINGS: Uterus

Measurements: 6.1 x 3.3 x 3.9 cm. No fibroids or other mass
visualized.

Endometrium

Thickness: 5 mm.  No focal abnormality visualized.

Right ovary

Measurements: 2.6 x 2.2 x 3.1 cm.  Multiple follicles.

Left ovary

Measurements: 5.8 x 2.7 x 3.4 cm. Hypoechoic mass measuring 4.6 x
2.6 x 2.7 cm

Other findings

No abnormal free fluid.
IMPRESSION: 1. Endometrial thickness of 5 mm. If bleeding remains unresponsive
to hormonal or medical therapy, sonohysterogram should be considered
for focal lesion work-up. (Ref: Radiological Reasoning: Algorithmic
Workup of Abnormal Vaginal Bleeding with Endovaginal Sonography and
Sonohysterography. AJR 0990; 191:S68-73)
2. 4.6 cm hypoechoic lesion left ovary, possible complicated or
hemorrhagic cyst. Suggest 6-12 week sonographic follow-up to ensure
resolution.

## 2020-02-03 IMAGING — US US TRANSVAGINAL NON-OB
2 series · 14 of 25 positions shown · non-contrast
Comparison: 04/01/2018

CLINICAL DATA: Followup ovarian cyst. LEFT ovarian cyst diagnosis
on 04/01/2018. The patient began oral birth control since that time.
Spotting. Unknown LMP.

EXAM:
ULTRASOUND PELVIS TRANSVAGINAL
TECHNIQUE: Transvaginal ultrasound examination of the pelvis was performed
including evaluation of the uterus, ovaries, adnexal regions, and
pelvic cul-de-sac.

[Series 1: us transvaginal non-ob · 0.11mm/px · 11 of 51 slices shown (1 of 2)]
[im 1/51]
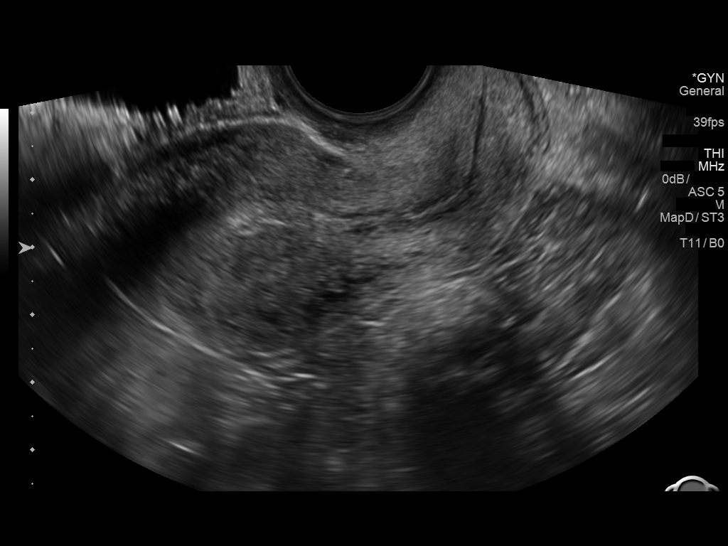
[im 6/51]
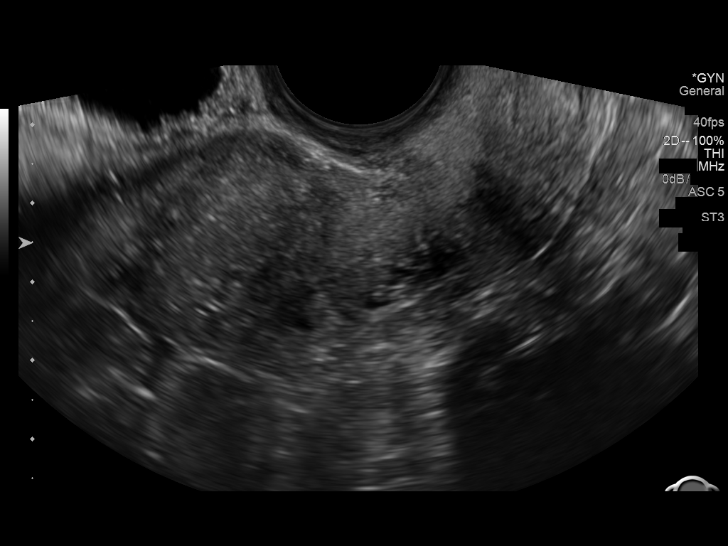
[im 11/51]
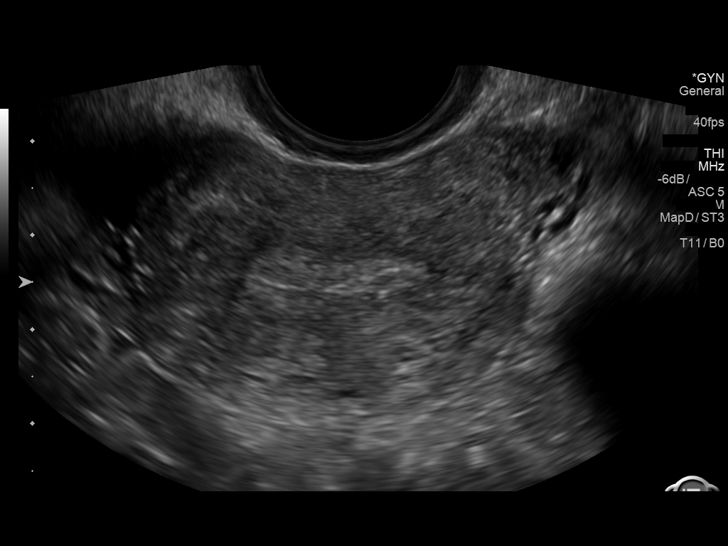
[im 16/51]
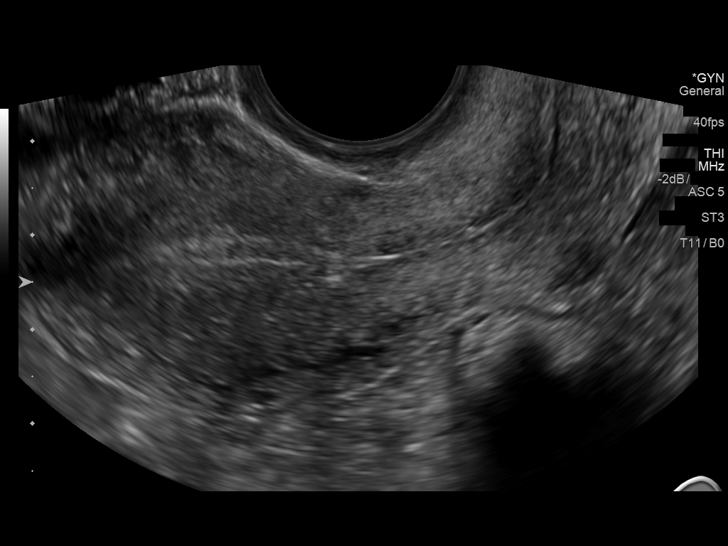
[im 22/51]
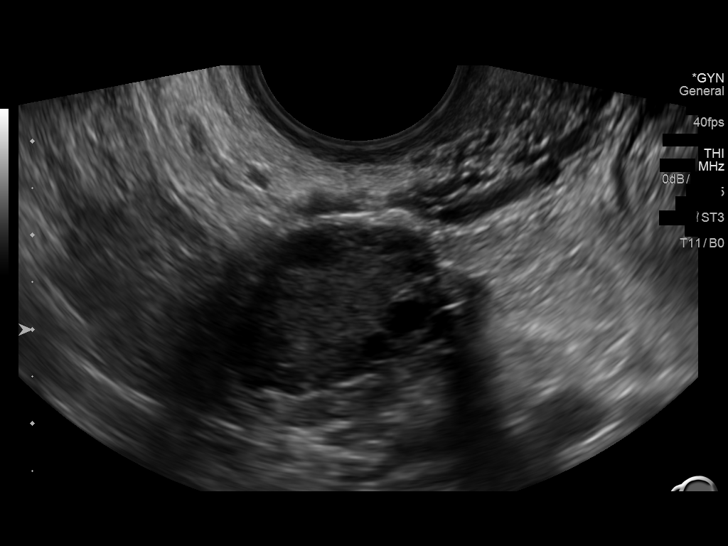
[im 24/51]
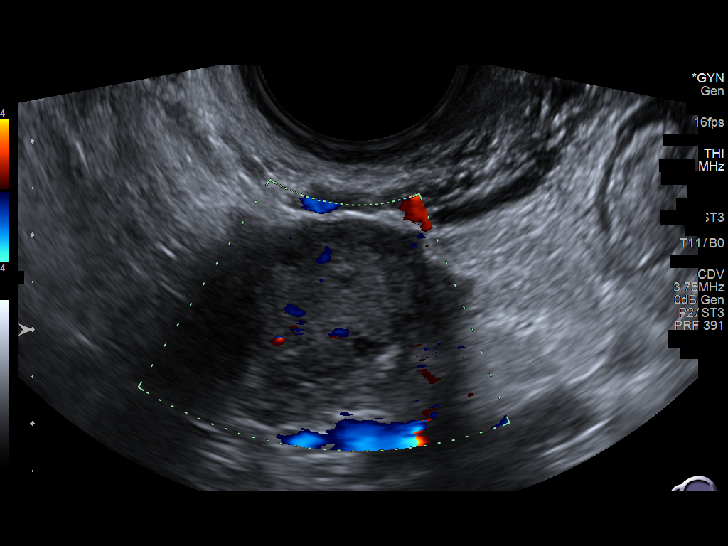
[im 29/51]
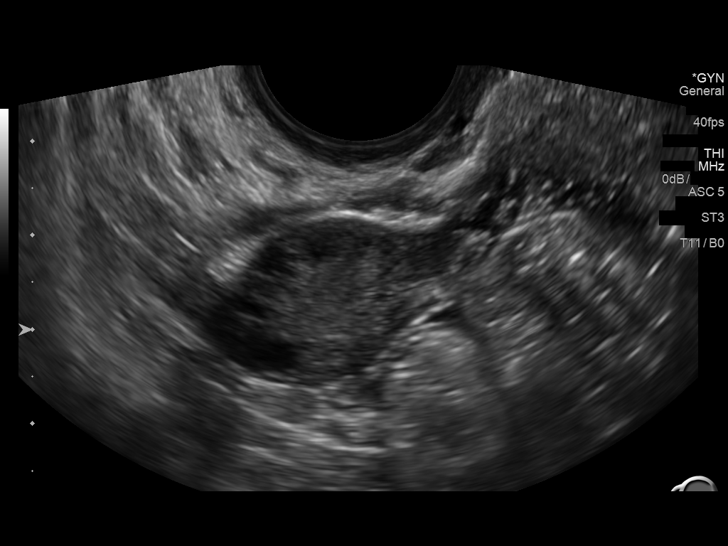
[im 35/51]
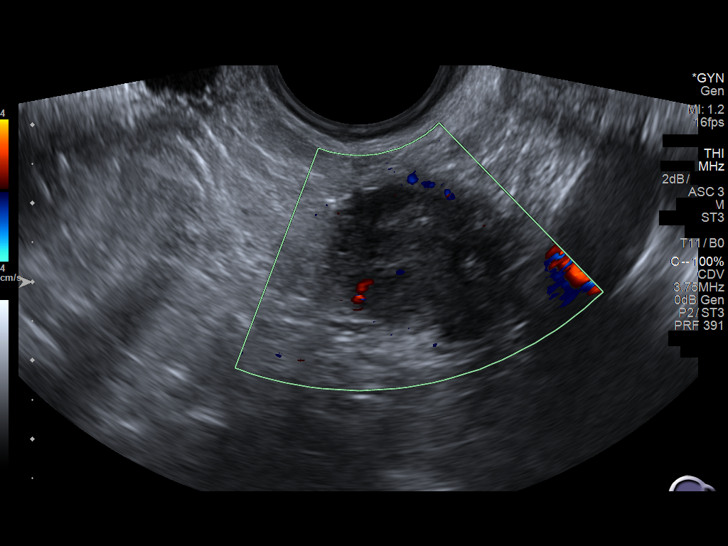
[im 40/51]
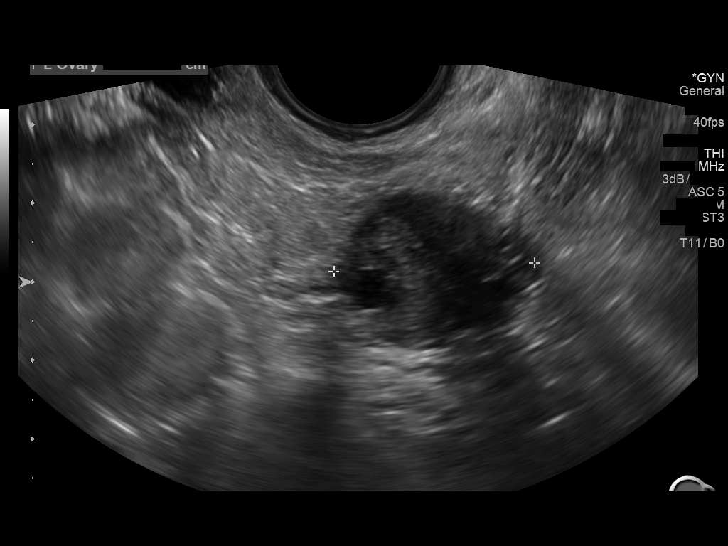
[im 43/51]
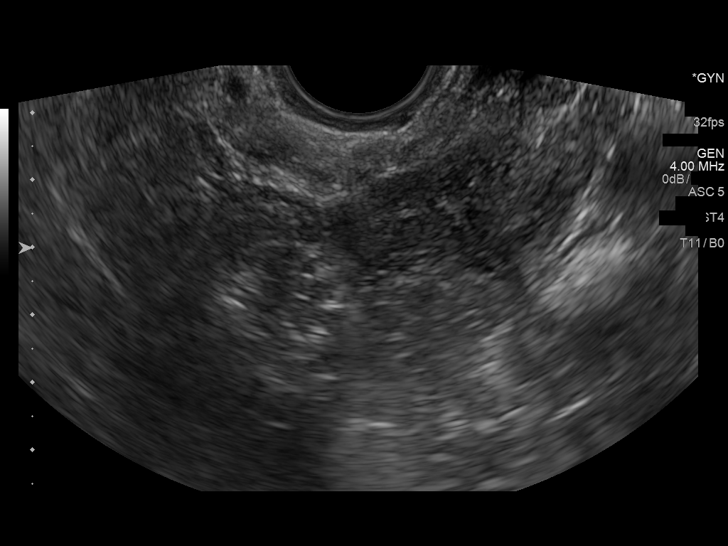
[im 48/51]
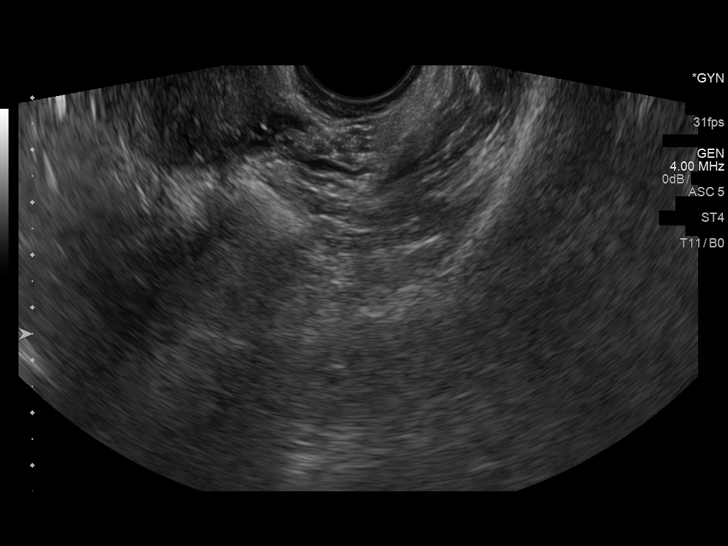

[Series 3: us transvaginal non-ob · 0.11mm/px · 3 of 12 slices shown (2 of 2)]
[im 1/12]
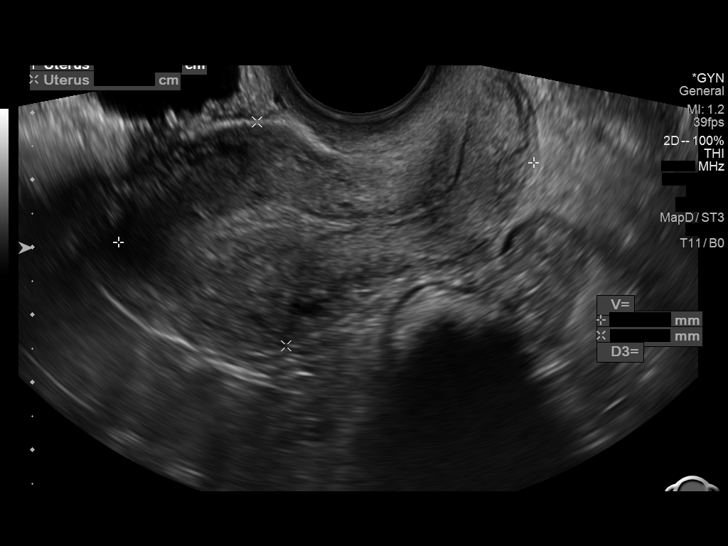
[im 6/12]
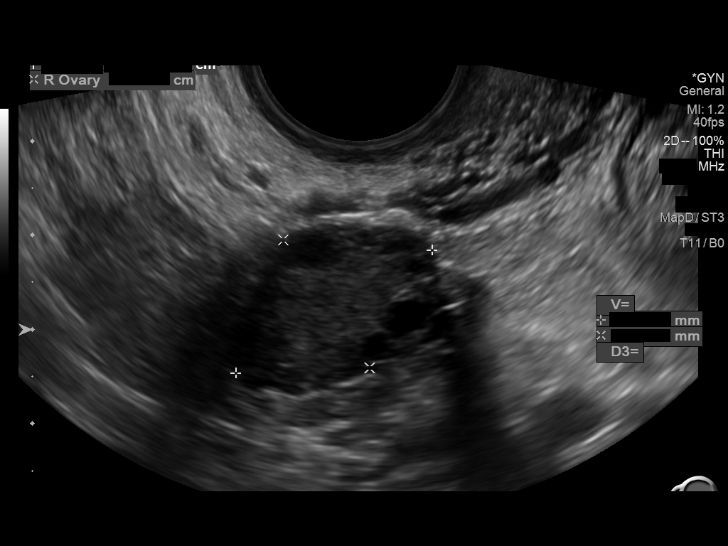
[im 12/12]
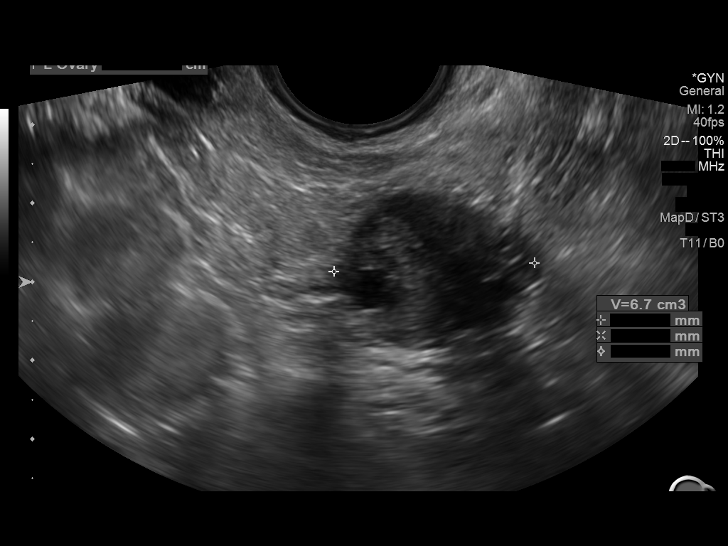

[14 of 25 positions shown; findings below may reference images not displayed]

FINDINGS: Uterus

Measurements: 6.3 x 3.4 x 4.4 centimeters = volume: 46.8 mL.
Anteflexed and normal in appearance.

Endometrium

Thickness: 6 millimeters.  No focal abnormality visualized.

Right ovary

Measurements: 2.5 x 1.6 x 2.0 centimeters = volume: 4.2 mL. Normal
appearance/no adnexal mass.

Left ovary

Measurements: 2.7 x 1.9 x 2.6 centimeter = volume: 6.7 mL. Normal
appearance/no adnexal mass.

Other findings:  No abnormal free fluid
IMPRESSION: Interval resolution of LEFT ovarian cyst.

No evidence for acute  abnormality.

## 2020-04-02 ENCOUNTER — Encounter: Payer: Self-pay | Admitting: Medical-Surgical

## 2020-04-02 ENCOUNTER — Other Ambulatory Visit: Payer: Self-pay

## 2020-04-02 ENCOUNTER — Ambulatory Visit (INDEPENDENT_AMBULATORY_CARE_PROVIDER_SITE_OTHER): Payer: Self-pay | Admitting: Medical-Surgical

## 2020-04-02 DIAGNOSIS — J4520 Mild intermittent asthma, uncomplicated: Secondary | ICD-10-CM

## 2020-04-02 MED ORDER — ALBUTEROL SULFATE HFA 108 (90 BASE) MCG/ACT IN AERS: 2.0000 | INHALATION_SPRAY | Freq: Four times a day (QID) | RESPIRATORY_TRACT | 3 refills | Status: DC | PRN

## 2020-04-02 NOTE — Progress Notes (Signed)
Subjective:    CC: asthma follow up, establish care  HPI: Pleasant 29 year old female presenting for to establish care with a new provider and for asthma follow-up. Has been doing well overall with her symptoms. Her last asthma attack was in February 2020 while at work. After that, her job moved her to a different area, away from high levels of dust. Notes her triggers as dust, exercise, and extreme temperature changes. Uses her Albuterol inhaler 2 puffs on average twice a day, before going to the gym and before going to work. Is planning on a trip to [place] next week and would like to have her Albuterol refilled. Denies chest pain, shortness of breath, weight loss, fever, and chills. Has never tried any other inhaler. Does not take an OTC antihistamine.  I reviewed the past medical history, family history, social history, surgical history, and allergies today and no changes were needed.  Please see the problem list section below in epic for further details.  Past Medical History: Past Medical History:  Diagnosis Date  . ADHD   . Allergy   . Asthma   . Dysmenorrhea   . Female pattern hair loss    Past Surgical History: Past Surgical History:  Procedure Laterality Date  . WISDOM TOOTH EXTRACTION     Social History: Social History   Socioeconomic History  . Marital status: Married    Spouse name: Not on file  . Number of children: Not on file  . Years of education: Not on file  . Highest education level: Not on file  Occupational History  . Occupation: unemployed  Tobacco Use  . Smoking status: Former Smoker    Types: Cigarettes    Quit date: 07/02/2012    Years since quitting: 7.7  . Smokeless tobacco: Never Used  . Tobacco comment: smoked <1 year  Vaping Use  . Vaping Use: Former  . Quit date: 04/02/2016  Substance and Sexual Activity  . Alcohol use: Yes    Comment: Occasionally  . Drug use: No  . Sexual activity: Yes    Birth control/protection: Condom, Pill   Other Topics Concern  . Not on file  Social History Narrative  . Not on file   Social Determinants of Health   Financial Resource Strain:   . Difficulty of Paying Living Expenses: Not on file  Food Insecurity:   . Worried About Programme researcher, broadcasting/film/video in the Last Year: Not on file  . Ran Out of Food in the Last Year: Not on file  Transportation Needs:   . Lack of Transportation (Medical): Not on file  . Lack of Transportation (Non-Medical): Not on file  Physical Activity:   . Days of Exercise per Week: Not on file  . Minutes of Exercise per Session: Not on file  Stress:   . Feeling of Stress : Not on file  Social Connections:   . Frequency of Communication with Friends and Family: Not on file  . Frequency of Social Gatherings with Friends and Family: Not on file  . Attends Religious Services: Not on file  . Active Member of Clubs or Organizations: Not on file  . Attends Banker Meetings: Not on file  . Marital Status: Not on file   Family History: Family History  Problem Relation Age of Onset  . Heart attack Maternal Grandfather   . Diabetes Paternal Grandfather   . Cancer Paternal Grandfather        prostate   Allergies: Allergies  Allergen Reactions  .  Azithromycin Other (See Comments)    Blisters in mouth, ? SJS   Medications: See med rec.  Review of Systems: See HPI for pertinent positives and negatives.  Objective:    General: Well Developed, well nourished, and in no acute distress.  Neuro: Alert and oriented x3.  HEENT: Normocephalic, atraumatic.  Skin: Warm and dry. Cardiac: Regular rate and rhythm, no murmurs rubs or gallops, no lower extremity edema.  Respiratory: Clear to auscultation bilaterally. Not using accessory muscles, speaking in full sentences.   Impression and Recommendations:    1. Mild intermittent asthma, unspecified whether complicated With frequency of albuterol use, may benefit from addition of a  LABA or ICS to her  regimen. Unfortunately, with no insurance, this is cost prohibitive. Continue Albuterol 2 puffs as needed before exercise and work shifts. Consider adding an OTC antihistamine to reduce reaction to dust.  - albuterol (VENTOLIN HFA) 108 (90 Base) MCG/ACT inhaler; Inhale 2 puffs into the lungs every 6 (six) hours as needed. 30 minutes before activity  Dispense: 16 g; Refill: 3  Return in about 1 year (around 04/02/2021) for asthma follow up. ___________________________________________ Thayer Ohm, DNP, APRN, FNP-BC Primary Care and Sports Medicine [facility] [place]

## 2020-08-09 ENCOUNTER — Encounter: Payer: Self-pay | Admitting: Medical-Surgical

## 2020-08-09 ENCOUNTER — Telehealth (INDEPENDENT_AMBULATORY_CARE_PROVIDER_SITE_OTHER): Payer: No Typology Code available for payment source | Admitting: Medical-Surgical

## 2020-08-09 VITALS — HR 91 | Temp 98.1°F

## 2020-08-09 DIAGNOSIS — R0789 Other chest pain: Secondary | ICD-10-CM | POA: Diagnosis not present

## 2020-08-09 DIAGNOSIS — R059 Cough, unspecified: Secondary | ICD-10-CM

## 2020-08-09 LAB — POCT INFLUENZA A/B
Influenza A, POC: NEGATIVE
Influenza B, POC: NEGATIVE

## 2020-08-09 NOTE — Progress Notes (Signed)
Virtual Visit via Video Note  I connected with Sonya West on 08/09/20 at  1:00 PM EST by a video enabled telemedicine application and verified that I am speaking with the correct person using two identifiers.   I discussed the limitations of evaluation and management by telemedicine and the availability of in person appointments. The patient expressed understanding and agreed to proceed.  Patient location: home Provider locations: office  Subjective:    CC: cough, chest tightness/congestion  HPI: Pleasant 30 year old female presenting via MyChart video visit with complaints of respiratory symptoms. Notes that last week she had a bit of a sore throat that improved without intervention. Four days ago, she awoke with a feeling of something in her throat and a return of her sore throat. Last night she developed a cough productive of very small amounts of clear to light yellow mucus and chest pressure. She has slight sinus congestion and notes a bad taste in her mouth. No fever, chills, ear pain/pressure, or reflux symptoms. She had COVID in 06/2019 but has not been vaccinated. She has not take a flu shot in many years.   Past medical history, Surgical history, Family history not pertinant except as noted below, Social history, Allergies, and medications have been entered into the medical record, reviewed, and corrections made.   Review of Systems: See HPI for pertinent positives and negatives.   Objective:    General: Speaking clearly in complete sentences without any shortness of breath.  Alert and oriented x3.  Normal judgment. No apparent acute distress.  Impression and Recommendations:    1. Cough/chest tightness Symptoms consistent with a viral infection at this point. Do not think she has pneumonia this early into the onset of respiratory symptoms. Recommend testing for COVID and Flu. Patient to come in at 2:30pm for a drive up swab.   Patient arrived for her swabs as directed. Tests  completed with negative flu results. COVID specimen sent to the lab. No concerning findings on brief exam of heart and lung sounds. Discussed obtaining a chest x-ray to verify but patient declined at that time. If she changes her mind or develops worsening symptoms, will be glad to order the x-ray and she can just swing by. Reviewed recommendation for symptom management using over the counter medications. Patient verbalized understanding.  - Novel Coronavirus, NAA ([facility]) - POCT Influenza A/B  I discussed the assessment and treatment plan with the patient. The patient was provided an opportunity to ask questions and all were answered. The patient agreed with the plan and demonstrated an understanding of the instructions.   The patient was advised to call back or seek an in-person evaluation if the symptoms worsen or if the condition fails to improve as anticipated.  25 minutes of non-face-to-face time was provided during this encounter.  Return if symptoms worsen or fail to improve.  Thayer Ohm, DNP, APRN, FNP-BC [facility]

## 2020-08-13 LAB — NOVEL CORONAVIRUS, NAA: SARS-CoV-2, NAA: DETECTED — AB

## 2020-08-13 MED ORDER — BUDESONIDE 180 MCG/ACT IN AEPB: 1.0000 | INHALATION_SPRAY | Freq: Two times a day (BID) | RESPIRATORY_TRACT | 6 refills | Status: DC

## 2020-08-13 NOTE — Addendum Note (Signed)
Addended byChristen Butter on: 08/13/2020 10:18 AM   Modules accepted: Orders

## 2020-08-16 ENCOUNTER — Telehealth: Payer: Self-pay | Admitting: *Deleted

## 2020-08-16 NOTE — Telephone Encounter (Signed)
Left patient a message that pharmacy has not faxed over refill request for birth control as of yet and the office closes at 12:00 on Friday's. The office will need the request prior to closing to be taken care of today per clinical staff.

## 2020-08-19 ENCOUNTER — Telehealth: Payer: Self-pay

## 2020-08-19 DIAGNOSIS — Z789 Other specified health status: Secondary | ICD-10-CM

## 2020-08-19 MED ORDER — NORGESTREL-ETHINYL ESTRADIOL 0.3-30 MG-MCG PO TABS: 1.0000 | ORAL_TABLET | Freq: Every day | ORAL | 0 refills | Status: DC

## 2020-08-19 NOTE — Telephone Encounter (Signed)
Pt needs one refill of OCP before annual appt. Annual scheduled foro 09/02/20.

## 2020-09-02 ENCOUNTER — Ambulatory Visit: Payer: No Typology Code available for payment source | Admitting: Obstetrics & Gynecology

## 2020-09-23 ENCOUNTER — Other Ambulatory Visit: Payer: Self-pay

## 2020-09-23 ENCOUNTER — Ambulatory Visit (INDEPENDENT_AMBULATORY_CARE_PROVIDER_SITE_OTHER): Payer: No Typology Code available for payment source | Admitting: Obstetrics & Gynecology

## 2020-09-23 ENCOUNTER — Other Ambulatory Visit (HOSPITAL_COMMUNITY)
Admission: RE | Admit: 2020-09-23 | Discharge: 2020-09-23 | Disposition: A | Discharge status: Home / Self Care | Payer: No Typology Code available for payment source | Source: Ambulatory Visit | Attending: Obstetrics & Gynecology | Admitting: Obstetrics & Gynecology

## 2020-09-23 ENCOUNTER — Encounter: Payer: Self-pay | Admitting: Obstetrics & Gynecology

## 2020-09-23 VITALS — BP 125/82 | HR 68 | Resp 16 | Ht 62.0 in | Wt 133.0 lb

## 2020-09-23 DIAGNOSIS — Z789 Other specified health status: Secondary | ICD-10-CM

## 2020-09-23 DIAGNOSIS — Z01419 Encounter for gynecological examination (general) (routine) without abnormal findings: Secondary | ICD-10-CM | POA: Diagnosis present

## 2020-09-23 MED ORDER — NORGESTREL-ETHINYL ESTRADIOL 0.3-30 MG-MCG PO TABS: 1.0000 | ORAL_TABLET | Freq: Every day | ORAL | 12 refills | Status: DC

## 2020-09-23 NOTE — Progress Notes (Signed)
Last pap- 11/30/17- negative

## 2020-09-23 NOTE — Progress Notes (Signed)
Subjective:     Sonya West is a 30 y.o. female here for a routine exam.  Current complaints: Patient ran out of birth control pills and [facility] was unable to contact us for a refill.  Patient encouraged to send me a MyChart message.  Is our policy to always refill birth control pills while waiting for an exam.  Patient understands and will do so in the future.  Patient has lost 60 pounds over the past 2 years.  She has done this with diet and exercise.  Patient also had female pattern baldness.  She saw a primary care doctor and a dermatologist.  They could not find a reason and she failed therapy.  She has shaved her head and is very happy with the cosmetic result.  Patient had her menstrual period last week and has not had intercourse since.  Patient will start her OCPs today. Gynecologic History Patient's last menstrual period was 09/10/2020. Contraception: OCP (estrogen/progesterone) Last Pap: 2019. Results were: normal Last mammogram: n/a.   Obstetric History OB History  Gravida Para Term Preterm AB Living  0 0 0 0 0 0  SAB IAB Ectopic Multiple Live Births  0 0 0 0 0     The following portions of the patient's history were reviewed and updated as appropriate: allergies, current medications, past family history, past medical history, past social history, past surgical history and problem list.  Review of Systems Pertinent items noted in HPI and remainder of comprehensive ROS otherwise negative.    Objective:      Vitals:   09/23/20 1056  BP: 125/82  Pulse: 68  Resp: 16  Weight: 133 lb (60.3 kg)  Height: 5\' 2"  (1.575 m)   Vitals:  WNL General appearance: alert, cooperative and no distress  HEENT: Normocephalic, without obvious abnormality, atraumatic Eyes: negative Throat: lips, mucosa, and tongue normal; teeth and gums normal  Respiratory: Clear to auscultation bilaterally  CV: Regular rate and rhythm  Breasts:  Normal appearance, no masses or tenderness, no nipple retraction  or dimpling  GI: Soft, non-tender; bowel sounds normal; no masses,  no organomegaly  GU: External Genitalia:  Tanner V, no lesion Urethra:  No prolapse   Vagina: Pink, normal rugae, no blood or discharge  Cervix: No CMT, no lesion  Uterus:  Normal size and contour, non tender  Adnexa: Normal, no masses, non tender  Musculoskeletal: No edema, redness or tenderness in the calves or thighs  Skin: No lesions or rash; female pattern baldness  Lymphatic: Axillary adenopathy: none     Psychiatric: Normal mood and behavior        Assessment:    Healthy female exam.    Plan:   1.  Pap with reflex testing 2.  Refill on OCPs.  Condoms the first two weeks of starting OCPs. 3.  Insurance directed me to send to mail order.  Note sent to patient via my chart that that is where the Rx went.  She will need to call her insurance and clear it being sent to local pharmacy.

## 2020-09-24 ENCOUNTER — Other Ambulatory Visit: Payer: Self-pay | Admitting: Obstetrics & Gynecology

## 2020-09-24 DIAGNOSIS — Z789 Other specified health status: Secondary | ICD-10-CM

## 2020-09-25 LAB — CYTOLOGY - PAP: Diagnosis: NEGATIVE

## 2021-03-10 ENCOUNTER — Telehealth: Payer: Self-pay | Admitting: General Practice

## 2021-03-10 NOTE — Telephone Encounter (Signed)
Transition Care Management Unsuccessful Follow-up Telephone Call  Date of discharge and from where:  03/09/21 from [facility]  Attempts:  1st Attempt  Reason for unsuccessful TCM follow-up call:  Left voice message    

## 2021-03-12 NOTE — Telephone Encounter (Signed)
Transition Care Management Unsuccessful Follow-up Telephone Call  Date of discharge and from where:  03/09/21 from [facility]  Attempts:  2nd Attempt  Reason for unsuccessful TCM follow-up call:  Left voice message Patient does have a follow up scheduled for 03/17/21 with Christen Butter, NP

## 2021-03-13 NOTE — Telephone Encounter (Signed)
Transition Care Management Unsuccessful Follow-up Telephone Call  Date of discharge and from where:  03/09/2021 from [facility]  Attempts:  3rd Attempt  Reason for unsuccessful TCM follow-up call:  Unable to reach patient    

## 2021-03-17 ENCOUNTER — Other Ambulatory Visit: Payer: Self-pay

## 2021-03-17 ENCOUNTER — Encounter: Payer: Self-pay | Admitting: Medical-Surgical

## 2021-03-17 ENCOUNTER — Ambulatory Visit (INDEPENDENT_AMBULATORY_CARE_PROVIDER_SITE_OTHER): Payer: No Typology Code available for payment source | Admitting: Medical-Surgical

## 2021-03-17 VITALS — BP 126/85 | HR 71 | Resp 20 | Ht 62.0 in | Wt 127.9 lb

## 2021-03-17 DIAGNOSIS — H00012 Hordeolum externum right lower eyelid: Secondary | ICD-10-CM

## 2021-03-17 NOTE — Progress Notes (Signed)
  HPI with pertinent ROS:   CC: hospital discharge follow up  HPI: Pleasant 30 year old female presenting today for hospital follow-up after presenting to the ED with a right eye concern.  Notes that she developed right eye swelling on 8/11 that was present for several days before suddenly worsening.  She had worsened eye swelling and discomfort on the right side for approximately 3 days before going to the emergency room.  They diagnosed her with a stye and started her on Augmentin.  She has been taking this as prescribed, tolerating well without side effects.  Notes she took her last dose yesterday evening.  The swelling of her right eye has completely resolved and she has no residual effects.  No eye discharge, vision changes, fevers, chills, shortness of breath, or chest pain.  I reviewed the past medical history, family history, social history, surgical history, and allergies today and no changes were needed.  Please see the problem list section below in epic for further details.   Physical exam:   General: Well Developed, well nourished, and in no acute distress.  Neuro: Alert and oriented x3. HEENT: Normocephalic, atraumatic.  Skin: Warm and dry. Cardiac: Regular rate and rhythm, no murmurs rubs or gallops, no lower extremity edema.  Respiratory: Clear to auscultation bilaterally. Not using accessory muscles, speaking in full sentences.  Impression and Recommendations:    1. Hordeolum externum of right lower eyelid Resolved.    Return if symptoms worsen or fail to improve. ___________________________________________ Thayer Ohm, DNP, APRN, FNP-BC Primary Care and Sports Medicine [facility]

## 2021-04-22 ENCOUNTER — Ambulatory Visit (INDEPENDENT_AMBULATORY_CARE_PROVIDER_SITE_OTHER): Payer: No Typology Code available for payment source | Admitting: Physician Assistant

## 2021-04-22 ENCOUNTER — Encounter: Payer: Self-pay | Admitting: Physician Assistant

## 2021-04-22 ENCOUNTER — Other Ambulatory Visit: Payer: Self-pay

## 2021-04-22 VITALS — BP 125/93 | HR 68 | Ht 62.0 in | Wt 131.0 lb

## 2021-04-22 DIAGNOSIS — Z23 Encounter for immunization: Secondary | ICD-10-CM | POA: Diagnosis not present

## 2021-04-22 DIAGNOSIS — R45851 Suicidal ideations: Secondary | ICD-10-CM | POA: Diagnosis not present

## 2021-04-22 DIAGNOSIS — F322 Major depressive disorder, single episode, severe without psychotic features: Secondary | ICD-10-CM | POA: Insufficient documentation

## 2021-04-22 HISTORY — DX: Major depressive disorder, single episode, severe without psychotic features: F32.2

## 2021-04-22 MED ORDER — SERTRALINE HCL 50 MG PO TABS
50.0000 mg | ORAL_TABLET | Freq: Every day | ORAL | 0 refills | Status: DC
Start: 2021-04-22 — End: 2021-05-14

## 2021-04-22 NOTE — Progress Notes (Signed)
Subjective:    Patient ID: Sonya West, female    DOB: [birth year], 30 y.o.   MRN: [id number]  HPI  Patient is a 30 y/o female who presents to the clinic with a chief complaint of "not mentally feeling well". Patient states over the last month she has been feeling depressed, decreased motivation, and decreased concentration. Patient states she has had more stress at work and her husband has not been able to find a job. 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 Patient states she is no longer finding joy in activities she used to. She states she has been more irritable towards her husband which is not normal for her. Patient states she has one co-worker she discusses this with but other than that she does not talk to anyone else.Patient states over the past week she has had thoughts of SI but states she does not have a plan. Patient denies ever experiencing anything like this before. Patient does have a sister who has bipolar disorder. Patient denies ever experiencing mania, times where she had more energy than usual, or times being more hyper than normal. Pt has never been on medication for mood.   .. 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  Constitutional:      Appearance: Normal appearance.  HENT:     Head: Normocephalic.  Cardiovascular:     Rate and Rhythm: Normal rate and regular rhythm.     Pulses: Normal pulses.     Heart sounds: Normal heart sounds.  Pulmonary:     Effort: Pulmonary effort is normal.     Breath sounds: Normal breath sounds.  Neurological:     Mental Status: She is alert.  Psychiatric:        Behavior: Behavior normal.        Thought Content: Thought content normal.        Judgment: Judgment normal.     Comments: Flat affect      .Marland Kitchen Depression screen [facility] 2/9 04/22/2021 03/17/2021 04/02/2020 01/03/2019 10/12/2017  Decreased Interest 2 0 0 0 0  Down, Depressed, Hopeless 3 0 0 0 0  PHQ - 2 Score 5 0 0 0 0  Altered sleeping 2 - 3 1 -  Tired, decreased energy 2 - 2 0 -  Change in appetite 1 - 0 0 -  Feeling bad or failure about yourself  3 - 0 0 -  Trouble concentrating 2 - 2 0 -  Moving slowly or fidgety/restless 1 - 0 0 -  Suicidal thoughts 2 - 0 0 -  PHQ-9 Score 18 - 7 1 -  Difficult doing work/chores Somewhat difficult - Somewhat difficult Not difficult  at all -   .. GAD 7 : Generalized Anxiety Score 04/22/2021 04/02/2020 01/03/2019  Nervous, Anxious, on Edge 2 0 0  Control/stop worrying 2 0 0  Worry too much - different things 2 0 0  Trouble relaxing 3 1 0  Restless 3 2 0  Easily annoyed or irritable 2 0 1  Afraid - awful might happen 1 0 0  Total GAD 7 Score 15 3 1   Anxiety Difficulty Somewhat difficult Not difficult at all -    MDQ 3/13    Assessment & Plan:  4/13Marland KitchenAmy was seen today for mental health problem.  Diagnoses and all orders for this visit:  Suicidal ideations -     sertraline (ZOLOFT) 50 MG tablet; Take 1 tablet (50 mg total) by mouth at bedtime. -     Ambulatory referral to Psychology  Need for Tdap vaccination -     Tdap vaccine greater than or equal to 7yo IM  Depression, major, single episode, severe (HCC) -     sertraline (ZOLOFT) 50 MG  tablet; Take 1 tablet (50 mg total) by mouth at bedtime. -     Ambulatory referral to Psychology  PHQ/GAD numbers very elevated.  MDQ not positive for bipolar depression.  Referral made to consider outpatient intensive counseling.  Start zoloft. Discussed side effects.  Continue working for now.  Follow up in 2 weeks with PCP.  Discussed Marland Kitchen UC if mood suddenly worsens. HO given on safety.   Sonya AnaMarland Kitchen PA-C, have reviewed and agree with the above documentation in it's entirety.   Spent 35 minutes with patient discussing mood history and medications and treatment plan.

## 2021-04-22 NOTE — Patient Instructions (Addendum)
For immediate mental health services open 24 hours and no referral needed:    [facility], [address] [place], [place] [address], [phone]    [facility], [address], [place], [place] [address], [phone]    [facility], [address] [place], [place] [address], [phone]    Any emergency room or 911    National Suicide Prevention Lifeline, [place]  Suicidal Feelings: How to Help Yourself Suicide is when you end your own life. There are many things you can do to help yourself feel better when struggling with these feelings. Many services and people are available to support you and others who struggle with similar feelings.  If you ever feel like you may hurt yourself or others, or have thoughts about taking your own life, get help right away. To get help: Call your local emergency services (911 in the [place] The [place] [address]'s health and human services helpline (211 in the [place] Go to your nearest emergency department. Call a suicide hotline to speak with a trained counselor. The following suicide hotlines are available in the [place]: 1-800-273-TALK 707-422-9935). 1-800-SUICIDE (360)166-7711). 928 207 6340. This is a hotline for Spanish speakers. 847-439-9919. This is a hotline for TTY users. 1-866-4-U-TREVOR 504-805-5318). This is a hotline for lesbian, gay, bisexual, transgender, or questioning youth. For a list of hotlines in [place], visit ItCheaper.dk.html Contact a crisis center or a local suicide prevention center. To find a crisis center or suicide prevention center: Call your local hospital, clinic, community service organization, mental health center, social service provider, or health department. Ask for help with connecting to a crisis center. For a list of crisis centers in the [place], visit:  suicidepreventionlifeline.org For a list of crisis centers in [place], visit: suicideprevention.ca How to help yourself feel better  Promise yourself that you will not do anything extreme when you have suicidal feelings. Remember the times you have felt hopeful. Many people have gotten through suicidal thoughts and feelings, and you can too. If you have had these feelings before, remind yourself that you can get through them again. Let family, friends, teachers, or counselors know how you are feeling. Try not to separate yourself from those who care about you and want to help you. Talk with someone every day, even if you do not feel sociable. Face-to-face conversation is best to help them understand your feelings. Contact a mental health care provider and work with this person regularly. Make a safety plan that you can follow during a crisis. Include phone numbers of suicide prevention hotlines, mental health professionals, and trusted friends and family members you can call during an emergency. Save these numbers on your phone. If you are thinking of taking a lot of medicine, give your medicine to someone who can give it to you as prescribed. If you are on antidepressants and are concerned you will overdose, tell your health care provider so that he or she can give you safer medicines. Try to stick to your routines and follow a schedule every day. Make self-care a priority. Make a list of realistic goals, and cross them off when you achieve them. Accomplishments can give you a sense of worth. Wait until you are feeling better before doing things that you find difficult or unpleasant. Do things that you have always enjoyed to take your mind off your feelings. Try reading a book, or listening to or playing music. Spending time outside, in nature, may help you feel better.  Follow these instructions at home:  Visit your primary health care provider every year for a checkup. Work with a mental health care  provider as needed. Eat a well-balanced diet, and eat regular meals. Get plenty of rest. Exercise if you are able. Just 30 minutes of exercise each day can help you feel better. Take over-the-counter and prescription medicines only as told by your health care provider. Ask your mental health care provider about the possible side effects of any medicines you are taking. Do not use alcohol or drugs, and remove these substances from your home. Remove weapons, poisons, knives, and other deadly items from your home. General recommendations Keep your living space well lit. When you are feeling well, write yourself a letter with tips and support that you can read when you are not feeling well. Remember that life's difficulties can be sorted out with help. Conditions can be treated, and you can learn behaviors and ways of thinking that will help you. Where to find more information National Suicide Prevention Lifeline: www.suicidepreventionlifeline.org Hopeline: www.hopeline.com [employer] for Suicide Prevention: https://www.ayers.com/ [employer] (for lesbian, gay, bisexual, transgender, or questioning youth): www.thetrevorproject.[employer] of Mental Health: http://www.wall.info/ Contact a health care provider if: You feel as though you are a burden to others. You feel agitated, angry, vengeful, or have extreme mood swings. You have withdrawn from family and friends. Get help right away if: You are talking about suicide or wishing to die. You start making plans for how to commit suicide. You feel that you have no reason to live. You start making plans for putting your affairs in order, saying goodbye, or giving your possessions away. You feel guilt, shame, or unbearable pain, and it seems like there is no way out. You are frequently using drugs or alcohol. You are engaging in risky behaviors that could lead to death. If you have any of these  symptoms, get help right away. Call emergency services, go to your nearest emergency department or crisis center, or call a suicide crisis helpline. Summary Suicide is when you take your own life. Promise yourself that you will not do anything extreme when you have suicidal feelings. Let family, friends, teachers, or counselors know how you are feeling. Get help right away if you start making plans for how to commit suicide. This information is not intended to replace advice given to you by your health care provider. Make sure you discuss any questions you have with your health care provider. Document Revised: 03/27/2020 Document Reviewed: 03/29/2020 Elsevier Patient Education  2022 [employer].

## 2021-04-23 NOTE — Addendum Note (Signed)
Addended by: Jomarie Longs on: 04/23/2021 12:59 PM   Modules accepted: Orders

## 2021-05-08 ENCOUNTER — Telehealth (HOSPITAL_COMMUNITY): Payer: Self-pay | Admitting: Psychiatry

## 2021-05-08 ENCOUNTER — Ambulatory Visit (INDEPENDENT_AMBULATORY_CARE_PROVIDER_SITE_OTHER): Payer: No Typology Code available for payment source | Admitting: Medical-Surgical

## 2021-05-08 ENCOUNTER — Encounter: Payer: Self-pay | Admitting: Medical-Surgical

## 2021-05-08 VITALS — BP 117/86 | HR 72 | Resp 20 | Ht 62.0 in | Wt 130.0 lb

## 2021-05-08 DIAGNOSIS — F322 Major depressive disorder, single episode, severe without psychotic features: Secondary | ICD-10-CM

## 2021-05-08 DIAGNOSIS — R45851 Suicidal ideations: Secondary | ICD-10-CM | POA: Diagnosis not present

## 2021-05-08 NOTE — Progress Notes (Signed)
  HPI with pertinent ROS:   CC: Mood follow-up  HPI: Pleasant 30 year old female presenting today for mood follow-up after being seen here in our clinic for severe depression and suicidal ideation 2 weeks ago.  She was started on Zoloft 50 mg nightly at that appointment.  Has been taking the medication as prescribed.  Notes that her first week, she had some excessive sleepiness but this has now resolved.  She takes her dose first in the morning when she goes to sleep as she works night shift.  Feels that the medication is helping and no longer has suicidal ideation or thoughts of being better off dead.  Has not been contacted by behavioral health intensive therapy yet.  Denies homicidal ideation.  I reviewed the past medical history, family history, social history, surgical history, and allergies today and no changes were needed.  Please see the problem list section below in epic for further details.  Depression screen [facility] 2/9 05/08/2021 04/22/2021 03/17/2021 04/02/2020 01/03/2019  Decreased Interest 1 2 0 0 0  Down, Depressed, Hopeless 0 3 0 0 0  PHQ - 2 Score 1 5 0 0 0  Altered sleeping 0 2 - 3 1  Tired, decreased energy 1 2 - 2 0  Change in appetite 0 1 - 0 0  Feeling bad or failure about yourself  0 3 - 0 0  Trouble concentrating 1 2 - 2 0  Moving slowly or fidgety/restless 0 1 - 0 0  Suicidal thoughts 0 2 - 0 0  PHQ-9 Score 3 18 - 7 1  Difficult doing work/chores Not difficult at all Somewhat difficult - Somewhat difficult Not difficult at all   GAD 7 : Generalized Anxiety Score 05/08/2021 04/22/2021 04/02/2020 01/03/2019  Nervous, Anxious, on Edge 0 2 0 0  Control/stop worrying 1 2 0 0  Worry too much - different things 0 2 0 0  Trouble relaxing 1 3 1  0  Restless 1 3 2  0  Easily annoyed or irritable 1 2 0 1  Afraid - awful might happen 0 1 0 0  Total GAD 7 Score 4 15 3 1   Anxiety Difficulty Not difficult at all Somewhat difficult Not difficult at all -     Physical exam:   General:  Well Developed, well nourished, and in no acute distress.  Neuro: Alert and oriented x3. HEENT: Normocephalic, atraumatic. Skin: Warm and dry.   Cardiac: Regular rate and rhythm, no murmurs rubs or gallops, no lower extremity edema.  Respiratory: Clear to auscultation bilaterally. Not using accessory muscles, speaking in full sentences.  Impression and Recommendations:    1. Suicidal ideations 2. Depression, major, single episode, severe (HCC) Responding well to Zoloft 50 mg daily so continue at this dose for now.  Advised her to check her voicemails and missed calls for contact from the outpatient intensive therapy folks.  I will check with our referral coordinator to see if she has any updates regarding scheduling.  Reviewed emergency psych options in the area should she have a resurgence of her suicidal ideation.  Return in about 4 weeks (around 06/05/2021) for mood follow up. ___________________________________________ , DNP, APRN, FNP-BC Primary Care and Sports Medicine [facility] [place]

## 2021-05-08 NOTE — Telephone Encounter (Signed)
D:  Pt's PCP office called stating that they had sent over an urgent referral for MH-IOP on 04-23-21.  Unfortunately, the case manager was never informed of the referral.  A:  Placed call to orient pt, but there was no answer.  Left vm for pt to call case manager back.

## 2021-05-14 ENCOUNTER — Other Ambulatory Visit: Payer: Self-pay | Admitting: Physician Assistant

## 2021-05-14 DIAGNOSIS — F322 Major depressive disorder, single episode, severe without psychotic features: Secondary | ICD-10-CM

## 2021-05-14 DIAGNOSIS — R45851 Suicidal ideations: Secondary | ICD-10-CM

## 2021-05-15 ENCOUNTER — Telehealth (HOSPITAL_COMMUNITY): Payer: Self-pay | Admitting: Psychiatry

## 2021-05-15 NOTE — Telephone Encounter (Signed)
D:  Pt phoned and left vm that she wanted more information about MH-IOP.  A:  Returned pt's call.  Oriented pt to MH-IOP.  Pt states she is very concerned about the cost because she is the only one working.  Encouraged pt to call her insurance co and verify her benefits because they may cover more than she thinks.  Pt declined.  Discussed other options with pt [facility] and [employer]) and provided her with phone numbers.  R:  Pt receptive.

## 2021-06-05 ENCOUNTER — Ambulatory Visit (INDEPENDENT_AMBULATORY_CARE_PROVIDER_SITE_OTHER): Payer: No Typology Code available for payment source | Admitting: Medical-Surgical

## 2021-06-05 ENCOUNTER — Other Ambulatory Visit: Payer: Self-pay

## 2021-06-05 ENCOUNTER — Encounter: Payer: Self-pay | Admitting: Medical-Surgical

## 2021-06-05 VITALS — BP 128/87 | HR 75 | Resp 20 | Ht 62.0 in | Wt 132.3 lb

## 2021-06-05 DIAGNOSIS — R45851 Suicidal ideations: Secondary | ICD-10-CM | POA: Diagnosis not present

## 2021-06-05 DIAGNOSIS — N939 Abnormal uterine and vaginal bleeding, unspecified: Secondary | ICD-10-CM

## 2021-06-05 DIAGNOSIS — F322 Major depressive disorder, single episode, severe without psychotic features: Secondary | ICD-10-CM

## 2021-06-05 LAB — WET PREP FOR TRICH, YEAST, CLUE
MICRO NUMBER:: 12620501
Specimen Quality: ADEQUATE

## 2021-06-05 NOTE — Progress Notes (Signed)
  HPI with pertinent ROS:   CC: Mood follow-up  HPI: Pleasant 30 year old female presenting today for follow-up on mood.  Approximately 4 weeks ago, she was seen for a follow-up on suicidal ideations.  At that time, she was 2 weeks into treatment on Zoloft 50 mg daily and had experienced a resolution of her suicidal ideations and thoughts of being better off dead.  Today, she presents reporting that she is doing very well on Zoloft and that her suicidal ideations have completely resolved.  She does have some life stressors in the setting of car problems that she is working on but otherwise feels that her mood is significantly improved.  She has not had any significant side effects and is tolerating the medication very well.  She does note that she has had some vaginal spotting over the last month occurring daily.  The spotting is brownish in color with no bright red noted.  Only has to wear a panty liner rather than using full pads or tampons to manage it.  No other vaginal discharge, odor, itching, irritation, dyspareunia, or pelvic pain.  I reviewed the past medical history, family history, social history, surgical history, and allergies today and no changes were needed.  Please see the problem list section below in epic for further details.   Physical exam:   General: Well Developed, well nourished, and in no acute distress.  Neuro: Alert and oriented x3.  HEENT: Normocephalic, atraumatic.  Skin: Warm and dry. Cardiac: Regular rate and rhythm.  Respiratory: Not using accessory muscles, speaking in full sentences.  Impression and Recommendations:    1. Depression, major, single episode, severe (HCC) 2. Suicidal ideations Continue Zoloft 50 mg daily.  Suicidal ideations resolved.  3. Abnormal uterine bleeding Possible medication side effect but she does have a history of abnormal uterine bleeding with a tubo-ovarian mass.  Getting updated pelvic ultrasound today.  Wet prep to check for  BV. - US Pelvic Complete With Transvaginal; Future - WET PREP FOR TRICH, YEAST, CLUE  Return in about 3 months (around 09/05/2021) for mood follow up. ___________________________________________ Thayer Ohm, DNP, APRN, FNP-BC Primary Care and Sports Medicine [facility] [place]

## 2021-06-18 ENCOUNTER — Ambulatory Visit (INDEPENDENT_AMBULATORY_CARE_PROVIDER_SITE_OTHER): Payer: No Typology Code available for payment source

## 2021-06-18 ENCOUNTER — Other Ambulatory Visit: Payer: Self-pay

## 2021-06-18 DIAGNOSIS — N939 Abnormal uterine and vaginal bleeding, unspecified: Secondary | ICD-10-CM | POA: Diagnosis not present

## 2021-07-11 ENCOUNTER — Telehealth: Payer: Self-pay | Admitting: General Practice

## 2021-07-11 NOTE — Telephone Encounter (Signed)
Transition Care Management Unsuccessful Follow-up Telephone Call  Date of discharge and from where:  07/10/21 from [facility]  Attempts:  1st Attempt  Reason for unsuccessful TCM follow-up call:  Left voice message

## 2021-07-14 NOTE — Telephone Encounter (Signed)
Transition Care Management Follow-up Telephone Call Date of discharge and from where: 07/10/21 from [facility] How have you been since you were released from the hospital? Still has left flank pain and nausea. Denies any fevers or chills Any questions or concerns? No  Items Reviewed: Did the pt receive and understand the discharge instructions provided? Yes  Medications obtained and verified? Yes  Other? No  Any new allergies since your discharge? No  Dietary orders reviewed? Yes Do you have support at home? Yes   Home Care and Equipment/Supplies: Were home health services ordered? no  Functional Questionnaire: (I = Independent and D = Dependent) ADLs: I  Bathing/Dressing- I  Meal Prep- I  Eating- I  Maintaining continence- I  Transferring/Ambulation- I  Managing Meds- I  Follow up appointments reviewed:  PCP Hospital f/u appt confirmed? Yes  Scheduled to see Hyman Hopes, NP on 07/17/21 @ 0910. Specialist Hospital f/u appt confirmed? No   Are transportation arrangements needed? No  If their condition worsens, is the pt aware to call PCP or go to the Emergency Dept.? Yes Was the patient provided with contact information for the PCP's office or ED? Yes Was to pt encouraged to call back with questions or concerns? Yes

## 2021-07-17 ENCOUNTER — Encounter: Payer: Self-pay | Admitting: Family Medicine

## 2021-07-17 ENCOUNTER — Ambulatory Visit (INDEPENDENT_AMBULATORY_CARE_PROVIDER_SITE_OTHER): Payer: No Typology Code available for payment source | Admitting: Family Medicine

## 2021-07-17 ENCOUNTER — Other Ambulatory Visit: Payer: Self-pay

## 2021-07-17 VITALS — BP 129/89 | HR 82 | Temp 98.5°F | Ht 62.0 in | Wt 137.0 lb

## 2021-07-17 DIAGNOSIS — N12 Tubulo-interstitial nephritis, not specified as acute or chronic: Secondary | ICD-10-CM

## 2021-07-17 NOTE — Progress Notes (Signed)
Acute Office Visit  Subjective:    Patient ID: Sonya West, female    DOB: 12-01-1990, 30 y.o.   MRN: [id number]  Chief Complaint  Patient presents with   Pyelonephritis    HPI Patient is in today for hospital follow-up for pyelonephritis.   07/10/21 ED: Patient presented with left sided flank pain radiating to abdomen along with nausea and vomiting. UA with hematuria. CBC with elevated neutrophils. CT Abd/pelvis was normal. Diagnosed with pyelonephritis. She was given ABX, fluids, antiemetics and pain medicines. Discharged on levaquin and zofran.   Today she reports she is feeling good, back to normal. She did have some mild flank pain and nausea on Monday (3 days ago), but feels like she let herself get dehydrated causing those symptoms. She has not had any symptoms since then. She denies any fevers, hematuria, flank pain, nausea, chest pain, dyspnea, abdominal pain. Reports she still has a few days left of antibiotics.      Past Medical History:  Diagnosis Date   ADHD    Allergy    Asthma    Dysmenorrhea    Female pattern hair loss     Past Surgical History:  Procedure Laterality Date   WISDOM TOOTH EXTRACTION      Family History  Problem Relation Age of Onset   Heart attack Maternal Grandfather    Diabetes Paternal Grandfather    Cancer Paternal Grandfather        prostate    Social History   Socioeconomic History   Marital status: Married    Spouse name: Not on file   Number of children: Not on file   Years of education: Not on file   Highest education level: Not on file  Occupational History   Occupation: unemployed  Tobacco Use   Smoking status: Former    Types: Cigarettes    Quit date: 07/02/2012    Years since quitting: 9.0   Smokeless tobacco: Never   Tobacco comments:    smoked <1 year  Vaping Use   Vaping Use: Former   Quit date: 04/02/2016  Substance and Sexual Activity   Alcohol use: Yes    Comment: Occasionally   Drug use: No   Sexual  activity: Yes    Birth control/protection: Condom, Pill  Other Topics Concern   Not on file  Social History Narrative   Not on file   Social Determinants of Health   Financial Resource Strain: Not on file  Food Insecurity: Not on file  Transportation Needs: Not on file  Physical Activity: Not on file  Stress: Not on file  Social Connections: Not on file  Intimate Partner Violence: Not on file    Outpatient Medications Prior to Visit  Medication Sig Dispense Refill   albuterol (VENTOLIN HFA) 108 (90 Base) MCG/ACT inhaler Inhale 2 puffs into the lungs every 6 (six) hours as needed. 30 minutes before activity 16 g 3   budesonide (PULMICORT) 180 MCG/ACT inhaler Inhale 1-2 puffs into the lungs 2 (two) times daily. 1 each 6   ibuprofen (ADVIL) 200 MG tablet Take 400 mg by mouth every 6 (six) hours as needed for headache.     Multiple Vitamins-Minerals (ONE DAILY MULTIVITAMIN ADULT PO) Take by mouth.     sertraline (ZOLOFT) 50 MG tablet TAKE 1 TABLET BY MOUTH EVERYDAY AT BEDTIME 90 tablet 1   No facility-administered medications prior to visit.    Allergies  Allergen Reactions   Azithromycin Other (See Comments)    Blisters  in mouth, ? SJS    Review of Systems All review of systems negative except what is listed in the HPI     Objective:    Physical Exam Vitals reviewed.  Constitutional:      Appearance: Normal appearance. She is normal weight.  HENT:     Head: Normocephalic and atraumatic.  Cardiovascular:     Rate and Rhythm: Normal rate and regular rhythm.     Heart sounds: Normal heart sounds.  Pulmonary:     Effort: Pulmonary effort is normal.     Breath sounds: Normal breath sounds.  Abdominal:     General: Abdomen is flat. There is no distension.     Palpations: Abdomen is soft.     Tenderness: There is no abdominal tenderness. There is no right CVA tenderness, left CVA tenderness or guarding.  Skin:    General: Skin is warm and dry.  Neurological:      General: No focal deficit present.     Mental Status: She is alert and oriented to person, place, and time.  Psychiatric:        Mood and Affect: Mood normal.        Behavior: Behavior normal.        Thought Content: Thought content normal.        Judgment: Judgment normal.    BP 129/89 (BP Location: Left Arm, Patient Position: Sitting, Cuff Size: Normal)    Pulse 82    Temp 98.5 F (36.9 C) (Oral)    Ht 5\' 2"  (1.575 m)    Wt 137 lb 0.6 oz (62.2 kg)    SpO2 100%    BMI 25.06 kg/m  Wt Readings from Last 3 Encounters:  07/17/21 137 lb 0.6 oz (62.2 kg)  06/05/21 132 lb 4.8 oz (60 kg)  05/08/21 130 lb (59 kg)    Health Maintenance Due  Topic Date Due   HIV Screening  Never done   Hepatitis C Screening  Never done    There are no preventive care reminders to display for this patient.   Lab Results  Component Value Date   TSH 1.81 07/14/2017   Lab Results  Component Value Date   WBC 10.2 03/29/2018   HGB 13.6 03/29/2018   HCT 38.8 03/29/2018   MCV 83.8 03/29/2018   PLT 345 03/29/2018   Lab Results  Component Value Date   NA 138 10/12/2017   K 3.9 10/12/2017   CO2 23 10/12/2017   GLUCOSE 82 10/12/2017   BUN 8 10/12/2017   CREATININE 0.59 10/12/2017   BILITOT 0.6 07/14/2017   AST 11 07/14/2017   ALT 8 07/14/2017   PROT 7.6 07/14/2017   CALCIUM 9.3 10/12/2017   No results found for: CHOL No results found for: HDL No results found for: LDLCALC No results found for: TRIG No results found for: [facility] Lab Results  Component Value Date   HGBA1C 4.8 04/25/2018       Assessment & Plan:   1. Pyelonephritis Patient has improved significantly well. Back to baseline. Recommend she finish out her antibiotics and then we will recheck CBC and urine to ensure resolution. Patient aware of signs and symptoms requiring further/urgent evaluation.   - CBC with Differential; Future - Urinalysis; Future  Follow-up as needed. Come to lab in the next week or two.   04/27/2018  Lollie Marrow, DNP, FNP-C

## 2021-07-17 NOTE — Patient Instructions (Signed)
So glad you are feeling better!  I would like to recheck your blood count and urine once you finish the antibiotics. Come by the lab in the next week or two at your convenience.   Merry Christmas!!

## 2021-07-24 ENCOUNTER — Other Ambulatory Visit: Payer: Self-pay

## 2021-07-24 DIAGNOSIS — N12 Tubulo-interstitial nephritis, not specified as acute or chronic: Secondary | ICD-10-CM

## 2021-07-25 LAB — CBC WITH DIFFERENTIAL/PLATELET
Absolute Monocytes: 956 cells/uL — ABNORMAL HIGH (ref 200–950)
Basophils Absolute: 64 cells/uL (ref 0–200)
Basophils Relative: 0.7 %
Eosinophils Absolute: 100 cells/uL (ref 15–500)
Eosinophils Relative: 1.1 %
HCT: 40.9 % (ref 35.0–45.0)
Hemoglobin: 14 g/dL (ref 11.7–15.5)
Lymphs Abs: 2484 cells/uL (ref 850–3900)
MCH: 31.1 pg (ref 27.0–33.0)
MCHC: 34.2 g/dL (ref 32.0–36.0)
MCV: 90.9 fL (ref 80.0–100.0)
MPV: 11.1 fL (ref 7.5–12.5)
Monocytes Relative: 10.5 %
Neutro Abs: 5496 cells/uL (ref 1500–7800)
Neutrophils Relative %: 60.4 %
Platelets: 301 10*3/uL (ref 140–400)
RBC: 4.5 10*6/uL (ref 3.80–5.10)
RDW: 12.5 % (ref 11.0–15.0)
Total Lymphocyte: 27.3 %
WBC: 9.1 10*3/uL (ref 3.8–10.8)

## 2021-07-25 LAB — URINALYSIS
Bilirubin Urine: NEGATIVE
Glucose, UA: NEGATIVE
Hgb urine dipstick: NEGATIVE
Ketones, ur: NEGATIVE
Leukocytes,Ua: NEGATIVE
Nitrite: NEGATIVE
Protein, ur: NEGATIVE
Specific Gravity, Urine: 1.021 (ref 1.001–1.035)
pH: 6.5 (ref 5.0–8.0)

## 2021-09-05 ENCOUNTER — Encounter: Payer: Self-pay | Admitting: Medical-Surgical

## 2021-09-05 ENCOUNTER — Ambulatory Visit (INDEPENDENT_AMBULATORY_CARE_PROVIDER_SITE_OTHER): Payer: No Typology Code available for payment source | Admitting: Medical-Surgical

## 2021-09-05 ENCOUNTER — Other Ambulatory Visit: Payer: Self-pay

## 2021-09-05 VITALS — BP 125/86 | HR 79 | Resp 20 | Ht 62.0 in | Wt 140.3 lb

## 2021-09-05 DIAGNOSIS — F322 Major depressive disorder, single episode, severe without psychotic features: Secondary | ICD-10-CM | POA: Diagnosis not present

## 2021-09-05 DIAGNOSIS — R45851 Suicidal ideations: Secondary | ICD-10-CM | POA: Diagnosis not present

## 2021-09-05 MED ORDER — SERTRALINE HCL 50 MG PO TABS: ORAL_TABLET | ORAL | 1 refills | Status: DC

## 2021-09-05 NOTE — Progress Notes (Signed)
HPI with pertinent ROS:   CC: mood f/up  HPI: Pleasant 31 year old female presenting today for follow-up on mood.  She has been taking sertraline 50 mg daily, tolerating well without side effects.  Feels the medication is working to help maintain her mood and relieve her depressive/anxiety symptoms.  Has noted over the past few weeks that she has been a little more cranky than usual but cannot associate this with any particular activity or cause.  Is not doing counseling.  Is amenable to the idea of counseling however the cost is the limiting factor.  She has had a little trouble sleeping lately and woke up 2 hours before her alarm yesterday for no known reason.  Once she is awake she has difficulty going back to sleep.  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  Denies SI/HI.  I reviewed the past medical history, family history, social history, surgical history, and allergies today and no changes were needed.  Please see the problem list section below in epic for further details. Depression screen [facility] 2/9 09/05/2021 06/05/2021 05/08/2021 04/22/2021 03/17/2021  Decreased Interest 0 0 1 2 0  Down, Depressed, Hopeless 0 0 0 3 0  PHQ - 2 Score 0 0 1 5 0  Altered sleeping 1 1 0 2 -  Tired, decreased energy 1 1 1 2  -  Change in appetite 0 0 0 1 -  Feeling bad or failure about yourself  0 0 0 3 -  Trouble concentrating 0 0 1 2 -  Moving slowly or fidgety/restless 0 0 0 1 -  Suicidal thoughts 0 0 0 2 -  PHQ-9 Score 2 2 3 18  -  Difficult doing work/chores Not difficult at all Not difficult at all Not difficult at all Somewhat difficult -   GAD 7 : Generalized Anxiety Score 09/05/2021 06/05/2021 05/08/2021 04/22/2021  Nervous, Anxious, on Edge 0 0 0 2  Control/stop worrying 0 0 1 2  Worry too much - different things 0 1 0 2  Trouble relaxing 1 1 1 3   Restless 1 0 1 3  Easily annoyed or irritable 1 0 1 2  Afraid - awful might  happen 0 0 0 1  Total GAD 7 Score 3 2 4 15   Anxiety Difficulty Not difficult at all Not difficult at all Not difficult at all Somewhat difficult      Physical exam:   General: Well Developed, well nourished, and in no acute distress.  Neuro: Alert and oriented x3.  HEENT: Normocephalic, atraumatic.  Skin: Warm and dry. Cardiac: Regular rate and rhythm, no murmurs rubs or gallops, no lower extremity edema.  Respiratory: Clear to auscultation bilaterally. Not using accessory muscles, speaking in full sentences.  Impression and Recommendations:    1. Depression, major, single episode, severe (HCC) 2. Suicidal ideations Symptoms well controlled today.  Continue sertraline 50 mg daily.  Discussed that this medication may be causing some of her irritability and an adjustment in dose may be beneficial.  She notes that the cranky feelings are mild and easily managed and she would like to avoid making changes at this time.  Recommend counseling but she will have to look at the financial aspects and decide if this is doable.  Discussed possible medication for sleep however she wants to see how that plays out and will let me know if she decides she would like a medication.  Return in about 6 months (around 03/05/2022) for  mood follow up. ___________________________________________ Thayer Ohm, DNP, APRN, FNP-BC Primary Care and Sports Medicine [facility] [place]

## 2021-09-11 ENCOUNTER — Other Ambulatory Visit: Payer: Self-pay

## 2021-09-11 ENCOUNTER — Emergency Department (INDEPENDENT_AMBULATORY_CARE_PROVIDER_SITE_OTHER): Payer: No Typology Code available for payment source

## 2021-09-11 ENCOUNTER — Emergency Department
Admission: EM | Admit: 2021-09-11 | Discharge: 2021-09-11 | Disposition: A | Discharge status: Home / Self Care | Payer: No Typology Code available for payment source | Source: Home / Self Care

## 2021-09-11 DIAGNOSIS — M25571 Pain in right ankle and joints of right foot: Secondary | ICD-10-CM | POA: Diagnosis not present

## 2021-09-11 NOTE — ED Triage Notes (Signed)
Pt presents to Urgent Care with c/o R ankle pain following injury--reports rolling her ankle while walking on pavement yesterday.

## 2021-09-11 NOTE — Discharge Instructions (Addendum)
Advised patient to RICE right ankle for 25 minutes 3 times today and tomorrow.  Advised patient may use OTC Ibuprofen 400 to 600 mg 2 times daily, as needed for the next 7 days for right ankle pain.  Encouraged patient to increase daily water intake while taking this medication.

## 2021-09-11 NOTE — ED Provider Notes (Signed)
[facility]    CSN: [id number] Arrival date & time: 09/11/21  1022      History   Chief Complaint Chief Complaint  Patient presents with   Ankle Pain    HPI Sonya West is a 31 y.o. female.   HPI 31 year old female presents with right ankle pain following injury yesterday.  Reports rolling her ankle while walking on pavement yesterday.  Patient is currently ambulating with crutches at this office visit.  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 Authorizing Provider  albuterol (VENTOLIN HFA) 108 (90 Base) MCG/ACT inhaler Inhale 2 puffs into the lungs every 6 (six) hours as needed. 30 minutes before activity 04/02/20   Christen Butter, NP  budesonide (PULMICORT) 180 MCG/ACT inhaler Inhale 1-2 puffs into the lungs 2 (two) times daily. 08/13/20   Christen Butter, NP  ibuprofen (ADVIL) 200 MG tablet Take 400 mg by mouth every 6 (six) hours as needed for headache.    [provider]  Multiple  Vitamins-Minerals (ONE DAILY MULTIVITAMIN ADULT PO) Take by mouth.    [provider]  sertraline (ZOLOFT) 50 MG tablet TAKE 1 TABLET BY MOUTH EVERYDAY AT BEDTIME 09/05/21   Christen Butter, NP    Family History Family History  Problem Relation Age of Onset   Asthma Mother    Asthma Father    Heart attack Maternal Grandfather    Diabetes Paternal Grandfather    Cancer Paternal Grandfather        prostate    Social History Social History   Tobacco Use   Smoking status: Former    Types: Cigarettes    Quit date: 07/02/2012    Years since quitting: 9.2   Smokeless tobacco: Never   Tobacco comments:    smoked <1 year  Vaping Use   Vaping Use: Former   Quit date: 04/02/2016  Substance Use Topics   Alcohol use: Yes    Comment: rarely   Drug use: No     Allergies   Azithromycin   Review of Systems Review of Systems  Musculoskeletal:        Right ankle pain x1 day  All other systems reviewed and are negative.   Physical Exam Triage  Vital Signs ED Triage Vitals  Enc Vitals Group     BP 09/11/21 1048 (!) 133/94     Pulse Rate 09/11/21 1048 91     Resp 09/11/21 1048 16     Temp 09/11/21 1048 98 F (36.7 C)     Temp Source 09/11/21 1048 Oral     SpO2 09/11/21 1048 97 %     Weight 09/11/21 1044 130 lb (59 kg)     Height 09/11/21 1044 5\' 2"  (1.575 m)     Head Circumference --      Peak Flow --      Pain Score 09/11/21 1043 6     Pain Loc --      Pain Edu? --      Excl. in GC? --    No data found.  Updated Vital Signs BP 134/84 (BP Location: Left Arm)    Pulse 91    Temp 98 F (36.7 C) (Oral)    Resp 16    Ht 5\' 2"  (1.575 m)    Wt 130 lb (59 kg)    LMP 08/20/2021 (Exact Date)    SpO2 97%    BMI 23.78 kg/m   Physical Exam Vitals and nursing note reviewed.  Constitutional:      General: She is not in acute distress.    Appearance: Normal appearance. She is normal weight. She is not ill-appearing.  HENT:     Head: Normocephalic and atraumatic.      Mouth/Throat:     Mouth: Mucous membranes are moist.     Pharynx: Oropharynx is clear.  Eyes:     Extraocular Movements: Extraocular movements intact.     Conjunctiva/sclera: Conjunctivae normal.     Pupils: Pupils are equal, round, and reactive to light.  Cardiovascular:     Rate and Rhythm: Normal rate and regular rhythm.     Pulses: Normal pulses.     Heart sounds: Normal heart sounds.  Pulmonary:     Effort: Pulmonary effort is normal.     Breath sounds: Normal breath sounds.  Musculoskeletal:     Cervical back: Normal range of motion and neck supple.     Comments: Right ankle: Mildly TTP over lateral malleolus with mild soft tissue swelling noted  Skin:    General: Skin is warm and dry.  Neurological:     General: No focal deficit present.     Mental Status: She is alert and oriented to person, place, and time.     UC Treatments / Results  Labs (all labs ordered are listed, but only abnormal results are displayed) Labs Reviewed - No data to display  EKG   Radiology DG Ankle Complete Right  Result Date: 09/11/2021 CLINICAL DATA:  A 31 year old female presents for evaluation of lateral ankle pain. EXAM: RIGHT ANKLE - COMPLETE 3+ VIEW COMPARISON:  None FINDINGS: No sign of acute fracture or dislocation. Mild soft tissue swelling about the ankle. Ankle mortise is intact on AP and oblique views. IMPRESSION: Mild soft tissue swelling without acute fracture or dislocation. Electronically Signed   By: 09/13/2021 M.D.   On: 09/11/2021 11:10    Procedures Procedures (including critical care time)  Medications Ordered in UC Medications - No data to display  Initial Impression / Assessment and Plan / UC Course  I have reviewed the triage vital signs and the nursing notes.  Pertinent labs & imaging results that were available during my care of the patient were reviewed by me and  considered in my medical decision making (see chart for details).     MDM: 1.  Acute right  ankle pain-Advised patient to RICE right ankle for 25 minutes 3 times today and tomorrow.  Advised patient may use OTC Ibuprofen 400 to 600 mg 2 times daily, as needed for the next 7 days for right ankle pain.  Encouraged patient to increase daily water intake while taking this medication.  Work note provided to patient prior to discharge.  Patient discharged home, hemodynamically stable. Final Clinical Impressions(s) / UC Diagnoses   Final diagnoses:  Acute right ankle pain     Discharge Instructions      Advised patient to RICE right ankle for 25 minutes 3 times today and tomorrow.  Advised patient may use OTC Ibuprofen 400 to 600 mg 2 times daily, as needed for the next 7 days for right ankle pain.  Encouraged patient to increase daily water intake while taking this medication.     ED Prescriptions   None    PDMP not reviewed this encounter.   Trevor Iha, FNP 09/11/21 1136

## 2021-10-02 ENCOUNTER — Other Ambulatory Visit: Payer: Self-pay | Admitting: Medical-Surgical

## 2022-01-10 ENCOUNTER — Encounter: Payer: Self-pay | Admitting: Medical-Surgical

## 2022-01-12 NOTE — Telephone Encounter (Signed)
Letter sent via my chart

## 2022-03-03 NOTE — Progress Notes (Unsigned)
   Established Patient Office Visit  Subjective   Patient ID: Sonya West, female   DOB: Dec 21, 1990 Age: 31 y.o. MRN: [id number]   No chief complaint on file.   HPI Pleasant 31 year old female presenting today for the following:  Mood:  Asthma:   Objective:    There were no vitals filed for this visit.  Physical Exam   No results found for this or any previous visit (from the past 24 hour(s)).   {Labs (Optional):23779}  The ASCVD Risk score (Arnett DK, et al., 2019) failed to calculate for the following reasons:   The 2019 ASCVD risk score is only valid for ages 43 to 21   Assessment & Plan:   No problem-specific Assessment & Plan notes found for this encounter.   No follow-ups on file.  ___________________________________________ Thayer Ohm, DNP, APRN, FNP-BC Primary Care and Sports Medicine [facility] [place]

## 2022-03-05 ENCOUNTER — Encounter: Payer: Self-pay | Admitting: Medical-Surgical

## 2022-03-05 ENCOUNTER — Ambulatory Visit (INDEPENDENT_AMBULATORY_CARE_PROVIDER_SITE_OTHER): Payer: No Typology Code available for payment source | Admitting: Medical-Surgical

## 2022-03-05 VITALS — BP 111/72 | HR 71 | Resp 20 | Ht 62.0 in | Wt 149.7 lb

## 2022-03-05 DIAGNOSIS — J45990 Exercise induced bronchospasm: Secondary | ICD-10-CM

## 2022-03-05 DIAGNOSIS — F322 Major depressive disorder, single episode, severe without psychotic features: Secondary | ICD-10-CM

## 2022-03-05 DIAGNOSIS — R45851 Suicidal ideations: Secondary | ICD-10-CM

## 2022-03-05 DIAGNOSIS — Z3141 Encounter for fertility testing: Secondary | ICD-10-CM

## 2022-03-05 MED ORDER — SERTRALINE HCL 50 MG PO TABS: ORAL_TABLET | ORAL | 3 refills | Status: DC

## 2022-03-08 ENCOUNTER — Encounter: Payer: Self-pay | Admitting: Medical-Surgical

## 2022-03-08 DIAGNOSIS — R946 Abnormal results of thyroid function studies: Secondary | ICD-10-CM

## 2022-03-08 LAB — FSH/LH
FSH: 3.6 m[IU]/mL
LH: 3.1 m[IU]/mL

## 2022-03-08 LAB — ESTRADIOL: Estradiol: 48 pg/mL

## 2022-03-08 LAB — TSH: TSH: 4.1 mIU/L

## 2022-03-08 LAB — PROLACTIN: Prolactin: 19.5 ng/mL

## 2022-03-08 LAB — TESTOSTERONE, TOTAL, LC/MS/MS: Testosterone, Total, LC-MS-MS: 24 ng/dL (ref 2–45)

## 2022-03-18 ENCOUNTER — Other Ambulatory Visit: Payer: Self-pay | Admitting: Medical-Surgical

## 2022-03-21 LAB — FSH/LH
FSH: 5.5 m[IU]/mL
LH: 6.5 m[IU]/mL

## 2022-03-21 LAB — PROLACTIN: Prolactin: 17.4 ng/mL

## 2022-03-21 LAB — TSH: TSH: 2.79 mIU/L

## 2022-03-21 LAB — ESTRADIOL: Estradiol: 58 pg/mL

## 2022-03-21 LAB — TESTOSTERONE, TOTAL, LC/MS/MS: Testosterone, Total, LC-MS-MS: 22 ng/dL (ref 2–45)

## 2022-06-12 ENCOUNTER — Ambulatory Visit (INDEPENDENT_AMBULATORY_CARE_PROVIDER_SITE_OTHER): Payer: No Typology Code available for payment source | Admitting: Physician Assistant

## 2022-06-12 ENCOUNTER — Encounter: Payer: Self-pay | Admitting: Physician Assistant

## 2022-06-12 VITALS — Ht 62.0 in | Wt 149.0 lb

## 2022-06-12 DIAGNOSIS — R42 Dizziness and giddiness: Secondary | ICD-10-CM | POA: Diagnosis not present

## 2022-06-12 NOTE — Progress Notes (Signed)
Acute Office Visit  Subjective:     Patient ID: Sonya West, female    DOB: [birth year], 31 y.o.   MRN: [id number]  Chief Complaint  Patient presents with   Dizziness    HPI Patient is in today for episode of dizziness at work on Sunday night, 5 days ago. She works for [employer] and has a very physical job. It had never happened before and not happened since. She denies any URI symptoms. She does drink water throughout the day. She "felt like the room was spinning".   .. 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      Objective:    Ht 5\' 2"  (1.575 m)   Wt 149 lb (67.6 kg)   SpO2 100%   BMI 27.25 kg/m  BP Readings from Last 3 Encounters:  03/05/22 111/72  09/11/21 134/84  09/05/21 125/86   Wt Readings from Last 3 Encounters:  06/12/22 149 lb (67.6 kg)  03/05/22 149 lb 11.2 oz (67.9 kg)  09/11/21 130 lb (59 kg)    .02/18/23Orthostatic VS for the past 24 hrs (Last 3 readings):  BP- Lying Pulse- Lying BP- Sitting Pulse- Sitting BP- Standing at 0 minutes Pulse- Standing at 0 minutes BP- Standing at 3 minutes Pulse- Standing at 3 minutes  06/12/22 1100 120/79 95 121/83 90 116/81 84 115/79 85     Physical Exam Vitals reviewed.  Constitutional:      Appearance: Normal appearance.  HENT:     Head:  Normocephalic.     Right Ear: Tympanic membrane, ear canal and external ear normal. There is no impacted cerumen.     Left Ear: Tympanic membrane, ear canal and external ear normal. There is no impacted cerumen.     Nose: Nose normal.     Mouth/Throat:     Mouth: Mucous membranes are moist.     Pharynx: No oropharyngeal exudate or posterior oropharyngeal erythema.  Eyes:     Extraocular Movements: Extraocular movements intact.     Conjunctiva/sclera: Conjunctivae normal.     Pupils: Pupils are equal, round, and reactive to light.  Neck:     Vascular: No carotid bruit.  Cardiovascular:     Rate and Rhythm: Normal rate and regular rhythm.  Pulmonary:     Effort: Pulmonary effort is normal.  Musculoskeletal:     Cervical back: No tenderness.  Lymphadenopathy:     Cervical: No cervical adenopathy.  Neurological:     General: No focal deficit present.     Mental Status: She is alert and oriented to person, place, and time.     Comments: Negative dixpike maneuver in office today.   Psychiatric:        Mood and Affect: Mood normal.           Assessment & Plan:  11/19/23Marland Kitchen  Sonya West was seen today for dizziness.  Diagnoses and all orders for this visit:  Vertigo   Likely vertigo Dix Hallpike maneuver negative Discussed epley manuevers Stay hydrated BP looks great Consider some flonase or zyrtec daily for allergy symptoms that can cause dizziness Follow up as needed or if symptoms occur again    Tandy Gaw, PA-C

## 2022-06-12 NOTE — Patient Instructions (Signed)
Epley Manuevers  Start flonase daily or zyrtec at bedtime.   Vertigo Vertigo is the feeling that you or the things around you are moving when they are not. This feeling can come and go at any time. Vertigo often goes away on its own. This condition can be dangerous if it happens when you are doing activities like driving or working with machines. Your doctor will do tests to find the cause of your vertigo. These tests will also help your doctor decide on the best treatment for you. Follow these instructions at home: Eating and drinking     Drink enough fluid to keep your pee (urine) pale yellow. Do not drink alcohol. Activity Return to your normal activities when your doctor says that it is safe. In the morning, first sit up on the side of the bed. When you feel okay, stand slowly while you hold onto something until you know that your balance is fine. Move slowly. Avoid sudden body or head movements or certain positions, as told by your doctor. Use a cane if you have trouble standing or walking. Sit down right away if you feel dizzy. Avoid doing any tasks or activities that can cause danger to you or others if you get dizzy. Avoid bending down if you feel dizzy. Place items in your home so that they are easy for you to reach without bending or leaning over. Do not drive or use machinery if you feel dizzy. General instructions Take over-the-counter and prescription medicines only as told by your doctor. Keep all follow-up visits. Contact a doctor if: Your medicine does not help your vertigo. Your problems get worse or you have new symptoms. You have a fever. You feel like you may vomit (nauseous), or this feeling gets worse. You start to vomit. Your family or friends see changes in how you act. You lose feeling (have numbness) in part of your body. You feel prickling and tingling in a part of your body. Get help right away if: You are always dizzy. You faint. You get very bad  headaches. You get a stiff neck. Bright light starts to bother you. You have trouble moving or talking. You feel weak in your hands, arms, or legs. You have changes in your hearing or in how you see (vision). These symptoms may be an emergency. Get help right away. Call your local emergency services (911 in the [place] Do not wait to see if the symptoms will go away. Do not drive yourself to the hospital. Summary Vertigo is the feeling that you or the things around you are moving when they are not. Your doctor will do tests to find the cause of your vertigo. You may be told to avoid some tasks, positions, or movements. Contact a doctor if your medicine is not helping, or if you have a fever, new symptoms, or a change in how you act. Get help right away if you get very bad headaches, or if you have changes in how you speak, hear, or see. This information is not intended to replace advice given to you by your health care provider. Make sure you discuss any questions you have with your health care provider. Document Revised: 06/12/2020 Document Reviewed: 06/12/2020 Elsevier Patient Education  2023 [employer].

## 2022-07-09 ENCOUNTER — Encounter: Payer: Self-pay | Admitting: Medical-Surgical

## 2022-09-07 ENCOUNTER — Ambulatory Visit (INDEPENDENT_AMBULATORY_CARE_PROVIDER_SITE_OTHER): Payer: No Typology Code available for payment source | Admitting: Medical-Surgical

## 2022-09-07 ENCOUNTER — Encounter: Payer: Self-pay | Admitting: Medical-Surgical

## 2022-09-07 VITALS — BP 113/73 | HR 70 | Resp 20 | Ht 62.0 in | Wt 157.7 lb

## 2022-09-07 DIAGNOSIS — F322 Major depressive disorder, single episode, severe without psychotic features: Secondary | ICD-10-CM

## 2022-09-07 DIAGNOSIS — J45990 Exercise induced bronchospasm: Secondary | ICD-10-CM | POA: Diagnosis not present

## 2022-09-07 NOTE — Progress Notes (Signed)
   Established Patient Office Visit  Subjective   Patient ID: Sonya West, female   DOB: [birth year] Age: 32 y.o. MRN: [id number]   Chief Complaint  Patient presents with   Follow-up   Asthma    MOOD   HPI Pleasant 32 year old female presenting today for the following:  Mood: Taking sertraline 50 mg daily, tolerating well without side effects.  Feels that the medication is working well for her and helping to keep her mood very stable.  Since her issues several months ago, she has become more vigilant regarding her overall mood.  Feels that her depressive symptoms are related to a desire to have a baby.  She and her husband have had a rough patch recently as he is on the fence about having a child.  They have since worked through this issue and are starting slowly with plans to get a kitten.  They have since been able to talk about the issue and he has decided he is open to having children.  Denies SI/HI.  Asthma: Has been using her albuterol inhaler as needed for rescue only.  Outside of being sick during December and needing it more frequently, she has only had to use it once recently.  Tried to get Pulmicort covered through her insurance however this was not doable.  Since her symptoms have been so mild, she feels that she does not need additional inhalers at this time but will let me know if she gets back in the gym.   Objective:    Vitals:   09/07/22 0816  BP: 113/73  Pulse: 70  Resp: 20  Height: 5\' 2"  (1.575 m)  Weight: 157 lb 11.2 oz (71.5 kg)  SpO2: 99%  BMI (Calculated): 28.84    Physical Exam Vitals and nursing note reviewed.  Constitutional:      General: She is not in acute distress.    Appearance: Normal appearance. She is not ill-appearing.  HENT:     Head: Normocephalic and atraumatic.  Cardiovascular:     Rate and Rhythm: Normal rate and regular rhythm.     Pulses: Normal pulses.     Heart sounds: Normal heart sounds.  Pulmonary:     Effort: Pulmonary effort is  normal. No respiratory distress.     Breath sounds: Normal breath sounds. No wheezing, rhonchi or rales.  Skin:    General: Skin is warm and dry.  Neurological:     Mental Status: She is alert and oriented to person, place, and time.  Psychiatric:        Mood and Affect: Mood normal.        Behavior: Behavior normal.        Thought Content: Thought content normal.        Judgment: Judgment normal.   No results found for this or any previous visit (from the past 24 hour(s)).     The ASCVD Risk score (Arnett DK, et al., 2019) failed to calculate for the following reasons:   The 2019 ASCVD risk score is only valid for ages 65 to 53   Assessment & Plan:   1. Depression, major, single episode, severe (HCC) Stable.  Continue sertraline 50 mg daily.  2. Exercise-induced bronchospasm Continue albuterol as needed.  Return for annual physical exam at your convenience.  ___________________________________________ Clearnce Sorrel, DNP, APRN, FNP-BC [facility] [place]

## 2022-10-09 IMAGING — US US PELVIS COMPLETE WITH TRANSVAGINAL
1 series · 14 of 25 positions shown · non-contrast
Comparison: 06/10/2018

CLINICAL DATA: Abnormal uterine bleeding for 3 weeks 3-4 weeks, LMP
05/11/2021



[Series 1: us pelvic complete with transvaginal · 14 of 93 slices shown]
[im 1/93]
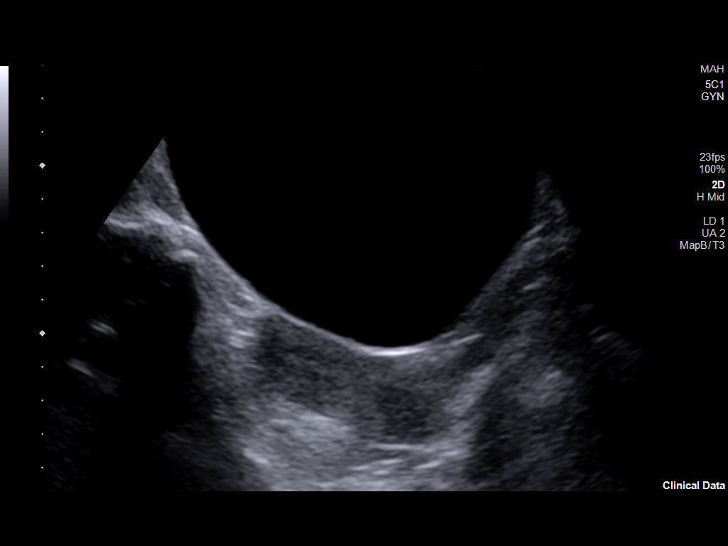
[im 8/93]
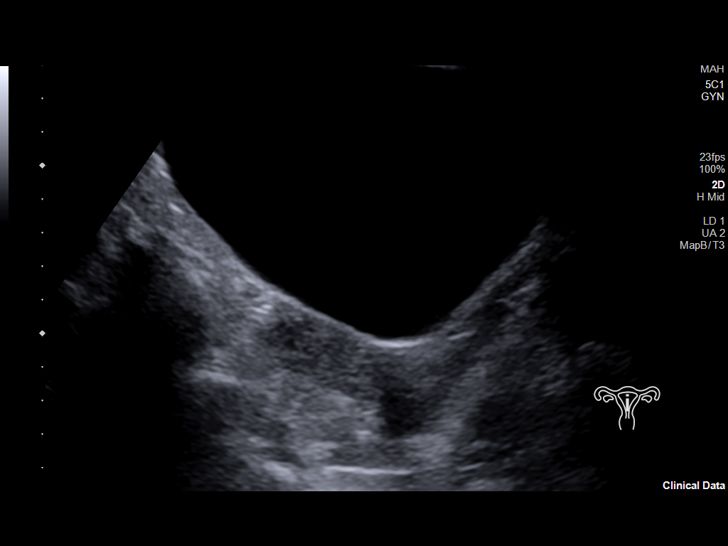
[im 16/93]
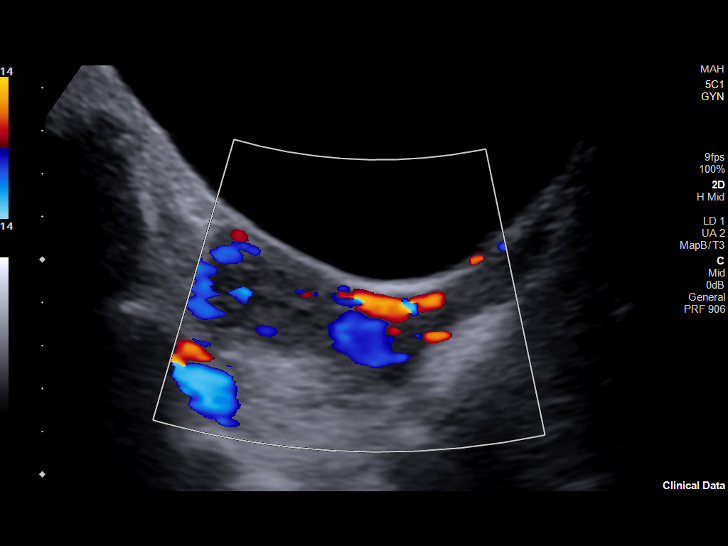
[im 24/93]
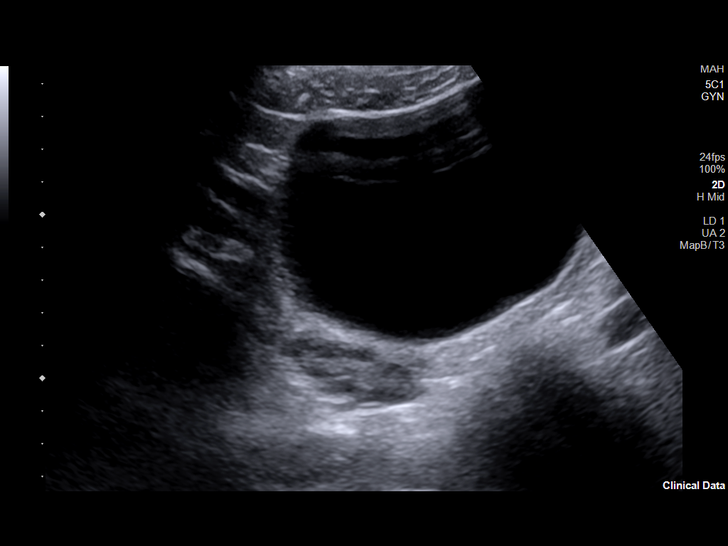
[im 31/93]
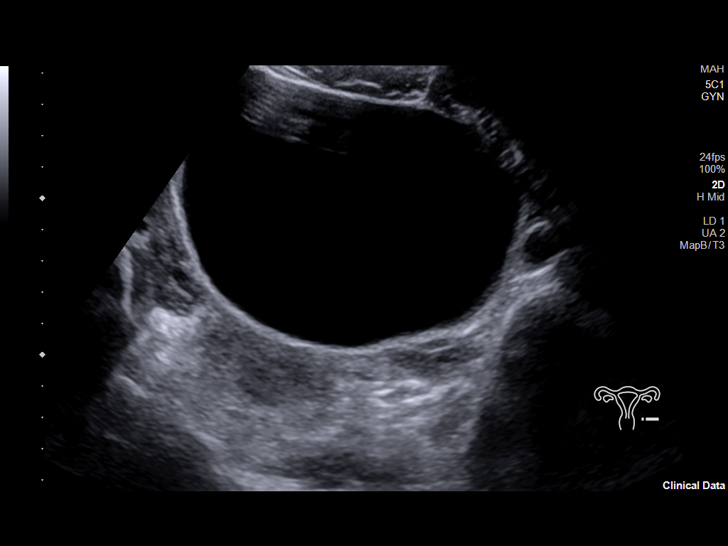
[im 35/93]
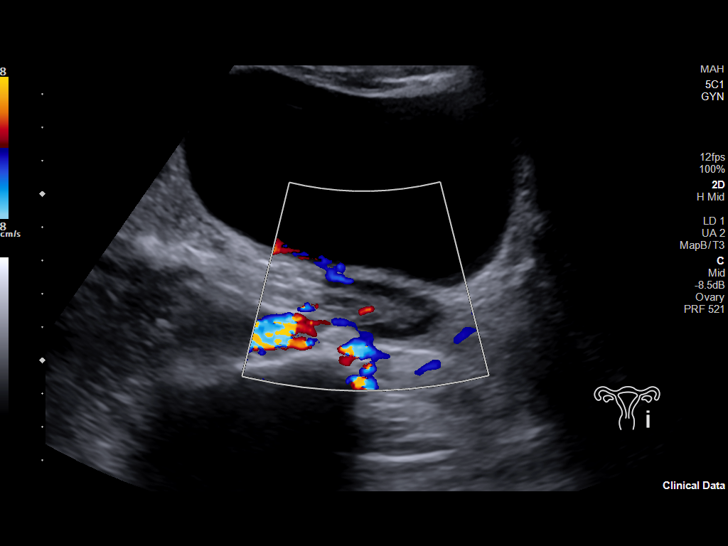
[im 43/93]
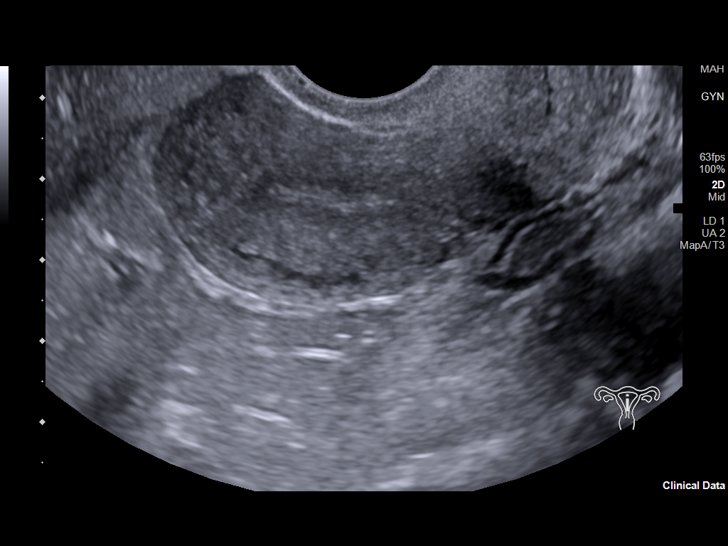
[im 50/93]
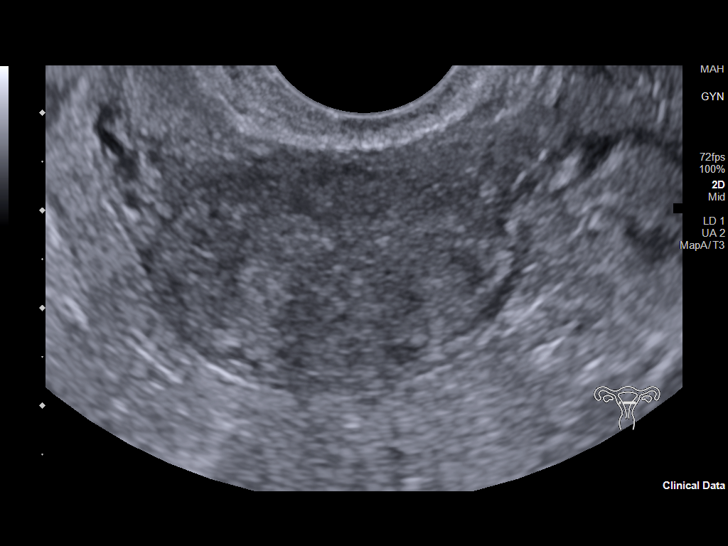
[im 58/93]
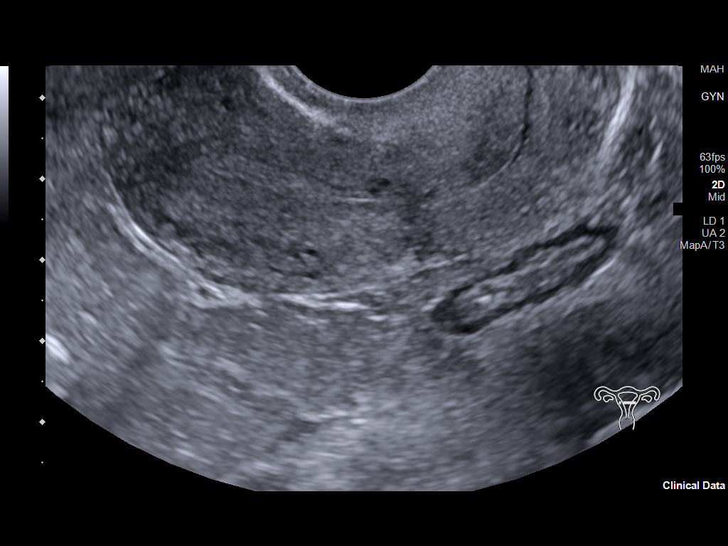
[im 62/93]
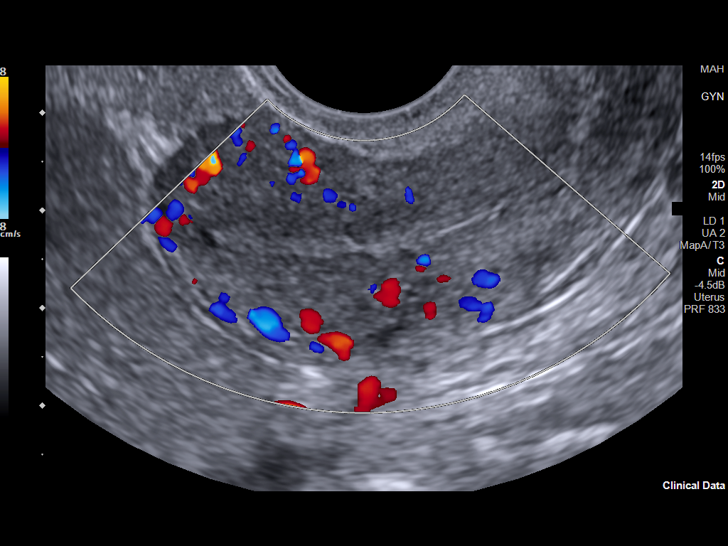
[im 70/93]
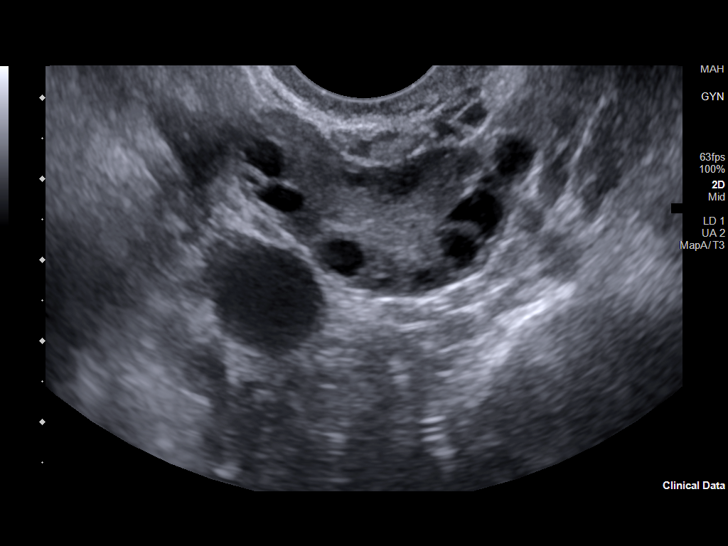
[im 77/93]
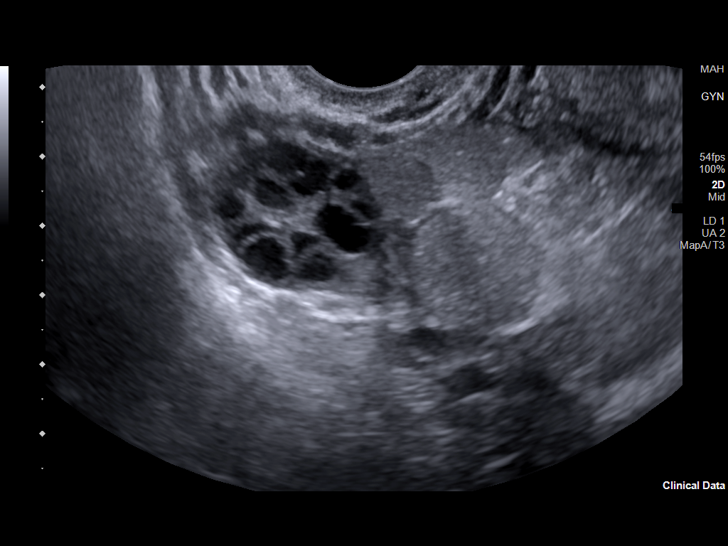
[im 85/93]
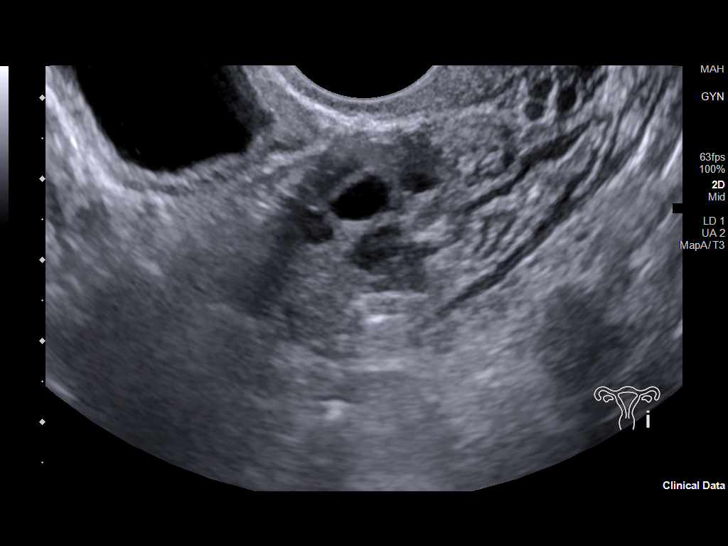
[im 93/93]
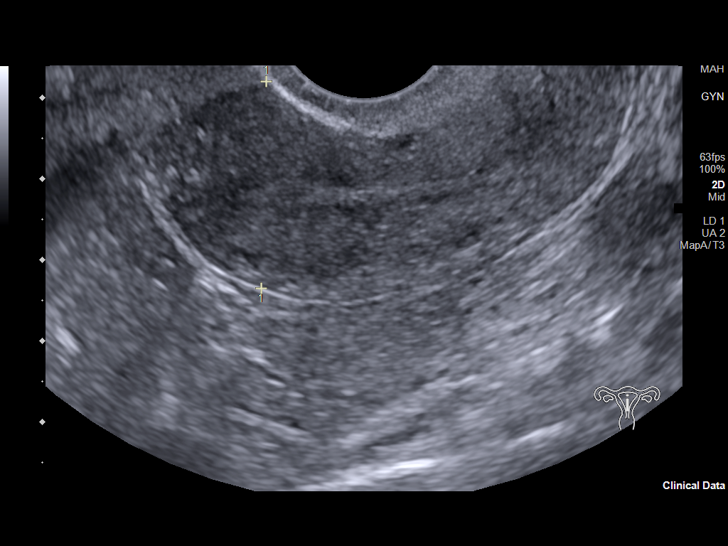

[14 of 25 positions shown; findings below may reference images not displayed]

FINDINGS: Uterus

Measurements: 7.0 x 2.7 x 3.8 cm = volume: 37 mL. Anteverted. Normal
morphology without mass

Endometrium

Thickness: 2 mm.  No endometrial fluid or mass

Right ovary

Measurements: 3.5 x 2.0 x 2.9 cm = volume: 10.9 mL. Multiple
follicles without dominant mass

Left ovary

Measurements: 3.4 x 2.6 x 3.1 cm = volume: 14.5 mL. Multiple
follicles without dominant mass

Other findings

No free pelvic fluid.  No adnexal masses.
IMPRESSION: No pelvic sonographic abnormalities identified.

## 2022-10-13 ENCOUNTER — Encounter: Payer: Self-pay | Admitting: Medical-Surgical

## 2022-10-13 ENCOUNTER — Ambulatory Visit (INDEPENDENT_AMBULATORY_CARE_PROVIDER_SITE_OTHER): Payer: No Typology Code available for payment source | Admitting: Medical-Surgical

## 2022-10-13 VITALS — BP 112/71 | HR 64 | Resp 20 | Ht 62.0 in | Wt 159.3 lb

## 2022-10-13 DIAGNOSIS — R946 Abnormal results of thyroid function studies: Secondary | ICD-10-CM

## 2022-10-13 DIAGNOSIS — R21 Rash and other nonspecific skin eruption: Secondary | ICD-10-CM

## 2022-10-13 DIAGNOSIS — Z0000 Encounter for general adult medical examination without abnormal findings: Secondary | ICD-10-CM | POA: Diagnosis not present

## 2022-10-13 DIAGNOSIS — Z13220 Encounter for screening for lipoid disorders: Secondary | ICD-10-CM

## 2022-10-13 DIAGNOSIS — J4520 Mild intermittent asthma, uncomplicated: Secondary | ICD-10-CM

## 2022-10-13 MED ORDER — ALBUTEROL SULFATE HFA 108 (90 BASE) MCG/ACT IN AERS: 2.0000 | INHALATION_SPRAY | Freq: Four times a day (QID) | RESPIRATORY_TRACT | 3 refills | Status: AC | PRN

## 2022-10-13 NOTE — Progress Notes (Signed)
Complete physical exam  Patient: Sonya West   DOB: Jul 10, 1991   32 y.o. Female  MRN: [id number]  Subjective:    Chief Complaint  Patient presents with   Annual Exam   Sonya West is a 32 y.o. female who presents today for a complete physical exam. She reports consuming a general diet. Gym/ health club routine includes cardio and light weights. She generally feels well. She reports sleeping well. She does not have additional problems to discuss today.    Most recent fall risk assessment:    10/13/2022    8:24 AM  [place] in the past year? 0  Number falls in past yr: 0  Injury with Fall? 0  Risk for fall due to : No Fall Risks  Follow up Falls evaluation completed     Most recent depression screenings:    10/13/2022    8:25 AM 09/07/2022    8:45 AM  PHQ 2/9 Scores  PHQ - 2 Score 0 0  PHQ- 9 Score  2    Vision:Not within last year , Dental: No current dental problems and No regular dental care , and STD: The patient denies history of sexually transmitted disease.    Patient Care Team: Samuel Bouche, NP as PCP - General (Nurse Practitioner) Guss Bunde, MD as Consulting Physician (Obstetrics and Gynecology)   Outpatient Medications Prior to Visit  Medication Sig   ibuprofen (ADVIL) 200 MG tablet Take 400 mg by mouth every 6 (six) hours as needed for headache.   Multiple Vitamins-Minerals (ONE DAILY MULTIVITAMIN ADULT PO) Take by mouth.   sertraline (ZOLOFT) 50 MG tablet TAKE 1 TABLET BY MOUTH EVERYDAY AT BEDTIME   [DISCONTINUED] albuterol (VENTOLIN HFA) 108 (90 Base) MCG/ACT inhaler Inhale 2 puffs into the lungs every 6 (six) hours as needed. 30 minutes before activity   No facility-administered medications prior to visit.    Review of Systems  Constitutional:  Negative for chills, fever, malaise/fatigue and weight loss.  HENT:  Negative for congestion, ear pain, hearing loss, sinus pain and sore throat.   Eyes:  Negative for blurred vision,  photophobia and pain.  Respiratory:  Negative for cough, shortness of breath and wheezing.   Cardiovascular:  Negative for chest pain, palpitations and leg swelling.  Gastrointestinal:  Negative for abdominal pain, constipation, diarrhea, heartburn, nausea and vomiting.  Genitourinary:  Negative for dysuria, frequency and urgency.  Musculoskeletal:  Negative for falls and neck pain.  Skin:  Negative for itching and rash.  Neurological:  Negative for dizziness, weakness and headaches.  Endo/Heme/Allergies:  Negative for polydipsia. Does not bruise/bleed easily.  Psychiatric/Behavioral:  Negative for depression, substance abuse and suicidal ideas. The patient is not nervous/anxious and does not have insomnia.      Objective:     BP 112/71 (BP Location: Left Arm, Cuff Size: Large)   Pulse 64   Resp 20   Ht 5\' 2"  (1.575 m)   Wt 159 lb 4.8 oz (72.3 kg)   SpO2 99%   BMI 29.14 kg/m    Physical Exam Constitutional:      General: She is not in acute distress.    Appearance: Normal appearance. She is not ill-appearing.  HENT:     Head: Normocephalic and atraumatic.     Right Ear: Tympanic membrane, ear canal and external ear normal. There is no impacted cerumen.     Left Ear: Tympanic membrane, ear canal and external ear normal. There is no impacted cerumen.  Nose: Nose normal. No congestion or rhinorrhea.     Mouth/Throat:     Mouth: Mucous membranes are moist.     Pharynx: No oropharyngeal exudate or posterior oropharyngeal erythema.  Eyes:     General: No scleral icterus.       Right eye: No discharge.        Left eye: No discharge.     Extraocular Movements: Extraocular movements intact.     Conjunctiva/sclera: Conjunctivae normal.     Pupils: Pupils are equal, round, and reactive to light.  Neck:     Thyroid: No thyromegaly.     Vascular: No carotid bruit or JVD.     Trachea: Trachea normal.  Cardiovascular:     Rate and Rhythm: Normal rate and regular rhythm.      Pulses: Normal pulses.     Heart sounds: Normal heart sounds. No murmur heard.    No friction rub. No gallop.  Pulmonary:     Effort: Pulmonary effort is normal. No respiratory distress.     Breath sounds: Normal breath sounds. No wheezing.  Abdominal:     General: Bowel sounds are normal. There is no distension.     Palpations: Abdomen is soft.     Tenderness: There is no abdominal tenderness. There is no guarding.  Musculoskeletal:        General: Normal range of motion.     Cervical back: Normal range of motion and neck supple.  Lymphadenopathy:     Cervical: No cervical adenopathy.  Skin:    General: Skin is warm and dry.     Findings: Rash (Papular nonpruritic rash over the upper chest) present.  Neurological:     Mental Status: She is alert and oriented to person, place, and time.     Cranial Nerves: No cranial nerve deficit.  Psychiatric:        Mood and Affect: Mood normal.        Behavior: Behavior normal.        Thought Content: Thought content normal.        Judgment: Judgment normal.   No results found for any visits on 10/13/22.     Assessment & Plan:    Routine Health Maintenance and Physical Exam  Immunization History  Administered Date(s) Administered   Tdap 04/22/2021    Health Maintenance  Topic Date Due   INFLUENZA VACCINE  10/25/2022 (Originally 02/24/2022)   HIV Screening  06/13/2023 (Originally 07/03/2007)   PAP SMEAR-Modifier  09/24/2023   DTaP/Tdap/Td (2 - Td or Tdap) 04/23/2031   HPV VACCINES  Aged Out   COVID-19 Vaccine  Discontinued   Hepatitis C Screening  Discontinued    Discussed health benefits of physical activity, and encouraged her to engage in regular exercise appropriate for her age and condition.  1. Annual physical exam Checking labs as below.  Up-to-date on preventative care.  Wellness information provided with AVS. - CBC with Differential/Platelet - COMPLETE METABOLIC PANEL WITH GFR - Lipid panel  2. Lipid  screening Checking lipid panel today. - Lipid panel  3. Abnormal thyroid function test Rechecking TSH. - TSH  4. Mild intermittent asthma, unspecified whether complicated Refilling albuterol inhaler. - albuterol (VENTOLIN HFA) 108 (90 Base) MCG/ACT inhaler; Inhale 2 puffs into the lungs every 6 (six) hours as needed. 30 minutes before activity  Dispense: 16 g; Refill: 3  5. Rash in adult Recent exposure to cashews, subsequent rash.  Checking for IgE to cashews. - Cashew  Specific IgE  Return  in about 1 year (around 10/13/2023) for annual physical exam or sooner if needed.   Samuel Bouche, NP

## 2022-10-14 LAB — CBC WITH DIFFERENTIAL/PLATELET
Absolute Monocytes: 671 cells/uL (ref 200–950)
Basophils Absolute: 47 cells/uL (ref 0–200)
Basophils Relative: 0.6 %
Eosinophils Absolute: 133 cells/uL (ref 15–500)
Eosinophils Relative: 1.7 %
HCT: 42.5 % (ref 35.0–45.0)
Hemoglobin: 14.8 g/dL (ref 11.7–15.5)
Lymphs Abs: 2699 cells/uL (ref 850–3900)
MCH: 30.8 pg (ref 27.0–33.0)
MCHC: 34.8 g/dL (ref 32.0–36.0)
MCV: 88.4 fL (ref 80.0–100.0)
MPV: 11.2 fL (ref 7.5–12.5)
Monocytes Relative: 8.6 %
Neutro Abs: 4251 cells/uL (ref 1500–7800)
Neutrophils Relative %: 54.5 %
Platelets: 328 10*3/uL (ref 140–400)
RBC: 4.81 10*6/uL (ref 3.80–5.10)
RDW: 12.8 % (ref 11.0–15.0)
Total Lymphocyte: 34.6 %
WBC: 7.8 10*3/uL (ref 3.8–10.8)

## 2022-10-14 LAB — ALLERGEN CASHEW
CLASS: 0
Cashew IgE: <0.1 kU/L

## 2022-10-14 LAB — COMPLETE METABOLIC PANEL WITH GFR
AG Ratio: 1.4 (calc) (ref 1.0–2.5)
ALT: 11 U/L (ref 6–29)
AST: 18 U/L (ref 10–30)
Albumin: 4.2 g/dL (ref 3.6–5.1)
Alkaline phosphatase (APISO): 53 U/L (ref 31–125)
BUN: 14 mg/dL (ref 7–25)
CO2: 28 mmol/L (ref 20–32)
Calcium: 9.5 mg/dL (ref 8.6–10.2)
Chloride: 103 mmol/L (ref 98–110)
Creat: 0.64 mg/dL (ref 0.50–0.97)
Globulin: 3.1 g/dL (calc) (ref 1.9–3.7)
Glucose, Bld: 75 mg/dL (ref 65–99)
Potassium: 3.6 mmol/L (ref 3.5–5.3)
Sodium: 140 mmol/L (ref 135–146)
Total Bilirubin: 0.5 mg/dL (ref 0.2–1.2)
Total Protein: 7.3 g/dL (ref 6.1–8.1)
eGFR: 122 mL/min/{1.73_m2} (ref >=60)

## 2022-10-14 LAB — INTERPRETATION:

## 2022-10-14 LAB — LIPID PANEL
Cholesterol: 122 mg/dL (ref <200)
HDL: 52 mg/dL (ref >=50)
LDL Cholesterol (Calc): 50 mg/dL (calc)
Non-HDL Cholesterol (Calc): 70 mg/dL (calc) (ref <130)
Total CHOL/HDL Ratio: 2.3 (calc) (ref <5.0)
Triglycerides: 122 mg/dL (ref <150)

## 2022-10-14 LAB — TSH: TSH: 1.64 mIU/L

## 2023-02-02 ENCOUNTER — Encounter: Payer: Self-pay | Admitting: Medical-Surgical

## 2023-02-02 ENCOUNTER — Other Ambulatory Visit: Payer: Self-pay | Admitting: Medical-Surgical

## 2023-02-02 ENCOUNTER — Ambulatory Visit (INDEPENDENT_AMBULATORY_CARE_PROVIDER_SITE_OTHER): Payer: No Typology Code available for payment source | Admitting: Medical-Surgical

## 2023-02-02 ENCOUNTER — Ambulatory Visit: Payer: No Typology Code available for payment source

## 2023-02-02 VITALS — BP 109/71 | HR 90 | Resp 20 | Ht 62.0 in | Wt 152.1 lb

## 2023-02-02 DIAGNOSIS — F322 Major depressive disorder, single episode, severe without psychotic features: Secondary | ICD-10-CM

## 2023-02-02 DIAGNOSIS — Z3201 Encounter for pregnancy test, result positive: Secondary | ICD-10-CM

## 2023-02-02 LAB — POCT URINE PREGNANCY: Preg Test, Ur: POSITIVE — AB

## 2023-02-02 NOTE — Progress Notes (Signed)
        Established patient visit  History, exam, impression, and plan:  1. Positive pregnancy test Pleasant 32 year old female presenting today with reports of nausea that started on 01/24/2023.  Has only had 1 episode of vomiting on 01/25/2023 when she was trying to eat some cereal for breakfast.  No recent illnesses or sick contacts.  No new restaurants, foods, or medications.  Having regular bowel movements with no melena or hematochezia.  No blood noted in emesis.  Feeling bloated and notes that her clothing has been tight.  Also notes that for several months, her menstrual cycles have been very irregular and she is having more spotting than usual.  Reports her most recent menstrual cycle started at the end of June and ended on July 6.  Currently still having some light spotting.  Not on birth control.  Sexually active with her husband but not actively trying to conceive.  UPT positive today.  Checking beta-hCG quantitative.  With vaginal bleeding, getting pelvic ultrasound with transvaginal for further investigation. - US Pelvic Complete With Transvaginal; Future - POCT urine pregnancy - hCG, quantitative, pregnancy  2. Depression, major, single episode, severe (HCC) Has been taking Zoloft 50 mg daily, tolerating well without side effects.  Previously her symptoms were pretty well-controlled and she was doing well overall.  Unfortunately, they recently had to euthanize their kitten due to severe incurable illness.  This occurred the weekend that her nausea sent in and she wondered if the nausea was related to anxiety/depression.  As noted above, it appears her nausea is morning sickness related to pregnancy instead.  She is very happy with this development.  We did discuss her current medication list and I would like for her to continue Zoloft 50 mg daily.  If we need to adjust her medications at some point, we can safely do so.  Procedures performed this visit: None.  Return if symptoms worsen  or fail to improve.  __________________________________ Thayer Ohm, DNP, APRN, FNP-BC Primary Care and Sports Medicine [facility]

## 2023-02-03 ENCOUNTER — Other Ambulatory Visit: Payer: Self-pay | Admitting: Medical-Surgical

## 2023-02-03 ENCOUNTER — Encounter: Payer: Self-pay | Admitting: Medical-Surgical

## 2023-02-03 DIAGNOSIS — Z3401 Encounter for supervision of normal first pregnancy, first trimester: Secondary | ICD-10-CM

## 2023-02-03 DIAGNOSIS — Z3201 Encounter for pregnancy test, result positive: Secondary | ICD-10-CM

## 2023-02-03 LAB — HCG, QUANTITATIVE, PREGNANCY: HCG, Total, QN: 47790 m[IU]/mL

## 2023-02-11 ENCOUNTER — Other Ambulatory Visit: Payer: Self-pay

## 2023-02-11 DIAGNOSIS — O209 Hemorrhage in early pregnancy, unspecified: Secondary | ICD-10-CM

## 2023-02-11 NOTE — Progress Notes (Signed)
U/S ordered per Dr.Forsyth. Pt has had "light bleeding" for three weeks consistently.

## 2023-02-15 ENCOUNTER — Ambulatory Visit: Payer: No Typology Code available for payment source

## 2023-02-15 DIAGNOSIS — Z3A00 Weeks of gestation of pregnancy not specified: Secondary | ICD-10-CM

## 2023-02-15 DIAGNOSIS — O209 Hemorrhage in early pregnancy, unspecified: Secondary | ICD-10-CM | POA: Diagnosis not present

## 2023-03-03 ENCOUNTER — Encounter: Payer: Self-pay | Admitting: *Deleted

## 2023-03-03 DIAGNOSIS — Z3490 Encounter for supervision of normal pregnancy, unspecified, unspecified trimester: Secondary | ICD-10-CM | POA: Insufficient documentation

## 2023-03-04 ENCOUNTER — Ambulatory Visit (INDEPENDENT_AMBULATORY_CARE_PROVIDER_SITE_OTHER): Payer: No Typology Code available for payment source | Admitting: Obstetrics and Gynecology

## 2023-03-04 ENCOUNTER — Encounter: Payer: Self-pay | Admitting: Obstetrics and Gynecology

## 2023-03-04 VITALS — BP 134/84 | HR 83 | Wt 149.0 lb

## 2023-03-04 DIAGNOSIS — Z3481 Encounter for supervision of other normal pregnancy, first trimester: Secondary | ICD-10-CM

## 2023-03-04 DIAGNOSIS — F322 Major depressive disorder, single episode, severe without psychotic features: Secondary | ICD-10-CM | POA: Diagnosis not present

## 2023-03-04 DIAGNOSIS — F988 Other specified behavioral and emotional disorders with onset usually occurring in childhood and adolescence: Secondary | ICD-10-CM

## 2023-03-04 DIAGNOSIS — Z3490 Encounter for supervision of normal pregnancy, unspecified, unspecified trimester: Secondary | ICD-10-CM

## 2023-03-04 DIAGNOSIS — J4520 Mild intermittent asthma, uncomplicated: Secondary | ICD-10-CM

## 2023-03-04 DIAGNOSIS — Z3401 Encounter for supervision of normal first pregnancy, first trimester: Secondary | ICD-10-CM

## 2023-03-04 DIAGNOSIS — Z3A11 11 weeks gestation of pregnancy: Secondary | ICD-10-CM

## 2023-03-04 NOTE — Progress Notes (Signed)
Subjective:   Sonya West is a 32 y.o. G1P0000 at [redacted]w[redacted]d by LMP c/w 6wk [place] being seen today for her first obstetrical visit.  Her obstetrical history is significant for  depression on zoloft .   Patient reports  nausea but tolerating well .  HISTORY: OB History  Gravida Para Term Preterm AB Living  1 0 0 0 0 0  SAB IAB Ectopic Multiple Live Births  0 0 0 0 0    # Outcome Date GA Lbr Len/2nd Weight Sex Type Anes PTL Lv  1 Current              Last pap smear: Lab Results  Component Value Date   DIAGPAP  09/23/2020    - Negative for intraepithelial lesion or malignancy (NILM)    Past Medical History:  Diagnosis Date   ADHD    Allergy    Asthma    Dysmenorrhea    Female pattern hair loss    Past Surgical History:  Procedure Laterality Date   WISDOM TOOTH EXTRACTION     Family History  Problem Relation Age of Onset   Asthma Mother    Asthma Father    Heart attack Maternal Grandfather    Diabetes Paternal Grandfather    Cancer Paternal Grandfather        prostate   Social History   Tobacco Use   Smoking status: Former    Current packs/day: 0.00    Types: Cigarettes    Quit date: 07/02/2012    Years since quitting: 10.6   Smokeless tobacco: Never   Tobacco comments:    smoked <1 year  Vaping Use   Vaping status: Former   Quit date: 04/02/2016  Substance Use Topics   Alcohol use: Yes    Comment: rarely   Drug use: No   Allergies  Allergen Reactions   Azithromycin Other (See Comments)    Blisters in mouth, ? SJS   Current Outpatient Medications on File Prior to Visit  Medication Sig Dispense Refill   albuterol (VENTOLIN HFA) 108 (90 Base) MCG/ACT inhaler Inhale 2 puffs into the lungs every 6 (six) hours as needed. 30 minutes before activity 16 g 3   Prenatal Vit-Fe Fumarate-FA (MULTIVITAMIN-PRENATAL) 27-0.8 MG TABS tablet Take 1 tablet by mouth daily at 12 noon.     sertraline (ZOLOFT) 50 MG tablet TAKE 1 TABLET BY MOUTH EVERYDAY AT BEDTIME 90 tablet  3   No current facility-administered medications on file prior to visit.     Exam   Vitals:   03/04/23 1343  BP: 134/84  Pulse: 83  Weight: 149 lb (67.6 kg)   Fetal Heart Rate (bpm): 168  General:  Alert, oriented and cooperative. Patient is in no acute distress.  Breast: Deferred  Cardiovascular: Normal heart rate noted  Respiratory: Normal respiratory effort, no problems with respiration noted  Abdomen: Soft, nontender Pain/Pressure: Absent        Extremities: Normal range of motion.  Edema: None  Mental Status: Normal mood and affect. Normal behavior. Normal judgment and thought content.    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 Encounter for supervision of normal pregnancy, antepartum, unspecified gravidity 2. [redacted] weeks gestation of pregnancy Initial labs drawn. Continue prenatal vitamins. Genetic Screening discussed:  NIPS, carrier screening ordered. Ultrasound discussed; fetal anatomic survey: ordered. Problem list reviewed and updated. The nature of [facility] with multiple MDs and other Advanced Practice Providers was explained to patient; also emphasized that residents, students are part of our team. Routine obstetric precautions reviewed. - Pregnancy, Initial Screen - [place] MFM OB COMP + 14 WK; Future - PANORAMA PRENATAL TEST - HORIZON CUSTOM  5. Mild intermittent asthma without complication No respiratory complaints, controlled with albuterol prn  6. Attention deficit disorder, unspecified hyperactivity presence No current meds  7. Depression, major, single episode, severe (HCC) Well controlled with zoloft. Hx SI, denies any currently. Discussed recommendation to continue zoloft through  pregnancy/postpartum due to maternal & fetal risks of uncontrolled depression. Reviewed risk of self limited withdrawal syndrome for babies born to mothers taking SSRI. Pt agreeable to plan and would like to continue zoloft  Return in about 4 weeks (around 04/01/2023) for return OB at 15-16 weeks.  Harvie Bridge, MD Obstetrician & Gynecologist, [facility] for [employer], [facility]

## 2023-03-07 ENCOUNTER — Encounter: Payer: Self-pay | Admitting: Obstetrics and Gynecology

## 2023-03-07 DIAGNOSIS — Z2839 Other underimmunization status: Secondary | ICD-10-CM | POA: Insufficient documentation

## 2023-03-19 LAB — PANORAMA PRENATAL TEST FULL PANEL:PANORAMA TEST PLUS 5 ADDITIONAL MICRODELETIONS

## 2023-04-01 ENCOUNTER — Other Ambulatory Visit: Payer: Self-pay | Admitting: Medical-Surgical

## 2023-04-01 DIAGNOSIS — R45851 Suicidal ideations: Secondary | ICD-10-CM

## 2023-04-01 DIAGNOSIS — F322 Major depressive disorder, single episode, severe without psychotic features: Secondary | ICD-10-CM

## 2023-04-08 ENCOUNTER — Encounter: Payer: Self-pay | Admitting: *Deleted

## 2023-04-08 ENCOUNTER — Ambulatory Visit (INDEPENDENT_AMBULATORY_CARE_PROVIDER_SITE_OTHER): Payer: No Typology Code available for payment source | Admitting: Obstetrics and Gynecology

## 2023-04-08 ENCOUNTER — Encounter: Payer: Self-pay | Admitting: Obstetrics and Gynecology

## 2023-04-08 VITALS — BP 109/74 | HR 102 | Wt 146.0 lb

## 2023-04-08 DIAGNOSIS — Z3490 Encounter for supervision of normal pregnancy, unspecified, unspecified trimester: Secondary | ICD-10-CM

## 2023-04-08 DIAGNOSIS — O09899 Supervision of other high risk pregnancies, unspecified trimester: Secondary | ICD-10-CM

## 2023-04-08 DIAGNOSIS — Z6791 Unspecified blood type, Rh negative: Secondary | ICD-10-CM

## 2023-04-08 DIAGNOSIS — Z2839 Other underimmunization status: Secondary | ICD-10-CM

## 2023-04-08 DIAGNOSIS — F32A Depression, unspecified: Secondary | ICD-10-CM

## 2023-04-08 DIAGNOSIS — Z3A16 16 weeks gestation of pregnancy: Secondary | ICD-10-CM

## 2023-04-08 DIAGNOSIS — O26899 Other specified pregnancy related conditions, unspecified trimester: Secondary | ICD-10-CM

## 2023-04-08 NOTE — Progress Notes (Signed)
   PRENATAL VISIT NOTE  Subjective:  Sonya West is a 32 y.o. G1P0000 at [redacted]w[redacted]d being seen today for ongoing prenatal care.  She is currently monitored for the following issues for this low-risk pregnancy and has Asthma; Attention deficit disorder; Depression, major, single episode, severe (HCC); Supervision of normal pregnancy; Rubella non-immune status, antepartum; Depression; and Rh negative state in antepartum period on their problem list.  Patient reports headache.  No bleeding episodes since one last month that resolved a few days after it occurred. Contractions: Not present. Vag. Bleeding: None.  Movement: Absent. Denies leaking of fluid.   The following portions of the patient's history were reviewed and updated as appropriate: allergies, current medications, past family history, past medical history, past social history, past surgical history and problem list.   Objective:   Vitals:   04/08/23 1413  BP: 109/74  Pulse: (!) 102  Weight: 146 lb (66.2 kg)    Fetal Status: Fetal Heart Rate (bpm): 142   Movement: Absent     General:  Alert, oriented and cooperative. Patient is in no acute distress.  Skin: Skin is warm and dry. No rash noted.   Cardiovascular: Normal heart rate noted  Respiratory: Normal respiratory effort, no problems with respiration noted  Abdomen: Soft, gravid, appropriate for gestational age.  Pain/Pressure: Absent     Pelvic: Cervical exam deferred        Extremities: Normal range of motion.  Edema: None  Mental Status: Normal mood and affect. Normal behavior. Normal judgment and thought content.   Assessment and Plan:  Pregnancy: G1P0000 at [redacted]w[redacted]d  1. Encounter for supervision of normal pregnancy, antepartum, unspecified gravidity - AFP, Serum, Open Spina Bifida - increase hydration, try caffeine to see if this improves headache  2. Rubella non-immune status, antepartum MMR post partum  3. [redacted] weeks gestation of pregnancy  4. Depression, unspecified  depression type Doing well on zoloft  5. Rh negative state in antepartum period Will need Rho gam at 28 weeks Did not receive with bleeding episode at 12 weeks reviewed recommendation for Rho gam for future bleeding episodes They verbalize understanding and importance of following up   Preterm labor symptoms and general obstetric precautions including but not limited to vaginal bleeding, contractions, leaking of fluid and fetal movement were reviewed in detail with the patient. Please refer to After Visit Summary for other counseling recommendations.   Return in about 4 weeks (around 05/06/2023) for low OB.  Future Appointments  Date Time Provider Department Center  04/29/2023 12:15 PM [facility] NURSE [facility]  04/29/2023 12:30 PM [facility] US4 [facility]  10/14/2023  8:30 AM Christen Butter, NP [facility] None    Conan Bowens, MD

## 2023-04-10 LAB — AFP, SERUM, OPEN SPINA BIFIDA
AFP MoM: 1.44
AFP Value: 47.6 ng/mL
Gest. Age on Collection Date: 15.9 wk
Maternal Age At EDD: 31.2 a
OSBR Risk 1 IN: 3220
Test Results:: NEGATIVE
Weight: 146 [lb_av]

## 2023-04-29 ENCOUNTER — Ambulatory Visit: Payer: No Typology Code available for payment source | Admitting: *Deleted

## 2023-04-29 ENCOUNTER — Ambulatory Visit: Payer: No Typology Code available for payment source | Attending: Obstetrics and Gynecology

## 2023-04-29 ENCOUNTER — Other Ambulatory Visit: Payer: Self-pay | Admitting: Obstetrics and Gynecology

## 2023-04-29 VITALS — BP 112/70 | HR 73

## 2023-04-29 DIAGNOSIS — J45909 Unspecified asthma, uncomplicated: Secondary | ICD-10-CM | POA: Insufficient documentation

## 2023-04-29 DIAGNOSIS — Z3490 Encounter for supervision of normal pregnancy, unspecified, unspecified trimester: Secondary | ICD-10-CM | POA: Diagnosis not present

## 2023-04-29 DIAGNOSIS — Z3A18 18 weeks gestation of pregnancy: Secondary | ICD-10-CM | POA: Diagnosis not present

## 2023-04-29 DIAGNOSIS — Z6791 Unspecified blood type, Rh negative: Secondary | ICD-10-CM | POA: Diagnosis not present

## 2023-04-29 DIAGNOSIS — O99342 Other mental disorders complicating pregnancy, second trimester: Secondary | ICD-10-CM | POA: Insufficient documentation

## 2023-04-29 DIAGNOSIS — Z3A11 11 weeks gestation of pregnancy: Secondary | ICD-10-CM

## 2023-04-29 DIAGNOSIS — Z3481 Encounter for supervision of other normal pregnancy, first trimester: Secondary | ICD-10-CM

## 2023-04-29 DIAGNOSIS — Z363 Encounter for antenatal screening for malformations: Secondary | ICD-10-CM | POA: Diagnosis present

## 2023-04-29 DIAGNOSIS — O99513 Diseases of the respiratory system complicating pregnancy, third trimester: Secondary | ICD-10-CM | POA: Insufficient documentation

## 2023-04-29 DIAGNOSIS — F99 Mental disorder, not otherwise specified: Secondary | ICD-10-CM | POA: Diagnosis not present

## 2023-04-29 DIAGNOSIS — F988 Other specified behavioral and emotional disorders with onset usually occurring in childhood and adolescence: Secondary | ICD-10-CM

## 2023-04-29 DIAGNOSIS — Z8279 Family history of other congenital malformations, deformations and chromosomal abnormalities: Secondary | ICD-10-CM | POA: Insufficient documentation

## 2023-04-29 DIAGNOSIS — O26892 Other specified pregnancy related conditions, second trimester: Secondary | ICD-10-CM | POA: Diagnosis not present

## 2023-04-29 DIAGNOSIS — J4520 Mild intermittent asthma, uncomplicated: Secondary | ICD-10-CM

## 2023-04-29 DIAGNOSIS — Z3401 Encounter for supervision of normal first pregnancy, first trimester: Secondary | ICD-10-CM

## 2023-04-29 DIAGNOSIS — F322 Major depressive disorder, single episode, severe without psychotic features: Secondary | ICD-10-CM

## 2023-05-05 DIAGNOSIS — O4440 Low lying placenta NOS or without hemorrhage, unspecified trimester: Secondary | ICD-10-CM | POA: Insufficient documentation

## 2023-05-05 NOTE — Progress Notes (Unsigned)
   PRENATAL VISIT NOTE  Subjective:  Sonya West is a 32 y.o. G1P0000 at [redacted]w[redacted]d being seen today for ongoing prenatal care.  She is currently monitored for the following issues for this low-risk pregnancy and has Asthma; Attention deficit disorder; Supervision of normal pregnancy; Rubella non-immune status, antepartum; Depression; and Rh negative state in antepartum period on their problem list.  Patient reports {[id number]}.   .  .   . Denies leaking of fluid.   The following portions of the patient's history were reviewed and updated as appropriate: allergies, current medications, past family history, past medical history, past social history, past surgical history and problem list.   Objective:  There were no vitals filed for this visit.  Fetal Status:           General:  Alert, oriented and cooperative. Patient is in no acute distress.  Skin: Skin is warm and dry. No rash noted.   Cardiovascular: Normal heart rate noted  Respiratory: Normal respiratory effort, no problems with respiration noted  Abdomen: Soft, gravid, appropriate for gestational age.        Pelvic: Cervical exam deferred        Extremities: Normal range of motion.     Mental Status: Normal mood and affect. Normal behavior. Normal judgment and thought content.   Assessment and Plan:  Pregnancy: G1P0000 at [redacted]w[redacted]d 1. Encounter for supervision of normal pregnancy, antepartum, unspecified gravidity LLP on anatomy US - 1.8 cm away. No need for follow up per MFM given early GA.  AFP negative  2. Rubella non-immune status, antepartum MMR after delivery  3. Rh negative state in antepartum period Give at 28 weeks and as indicated  4. Depression, unspecified depression type ***  5. Pregnancy with 20 completed weeks gestation   Preterm labor symptoms and general obstetric precautions including but not limited to vaginal bleeding, contractions, leaking of fluid and fetal movement were reviewed in detail with the  patient. Please refer to After Visit Summary for other counseling recommendations.   No follow-ups on file.  Future Appointments  Date Time Provider Department Center  05/06/2023  3:50 PM Milas Hock, MD [facility]  10/14/2023  8:30 AM Christen Butter, NP [facility] None    Milas Hock, MD

## 2023-05-06 ENCOUNTER — Encounter: Payer: Self-pay | Admitting: Obstetrics and Gynecology

## 2023-05-06 ENCOUNTER — Ambulatory Visit: Payer: No Typology Code available for payment source | Admitting: Obstetrics and Gynecology

## 2023-05-06 VITALS — BP 117/75 | HR 74 | Wt 152.0 lb

## 2023-05-06 DIAGNOSIS — Z3A20 20 weeks gestation of pregnancy: Secondary | ICD-10-CM

## 2023-05-06 DIAGNOSIS — Z3490 Encounter for supervision of normal pregnancy, unspecified, unspecified trimester: Secondary | ICD-10-CM

## 2023-05-06 DIAGNOSIS — Z6791 Unspecified blood type, Rh negative: Secondary | ICD-10-CM

## 2023-05-06 DIAGNOSIS — O26899 Other specified pregnancy related conditions, unspecified trimester: Secondary | ICD-10-CM

## 2023-05-06 DIAGNOSIS — O4440 Low lying placenta NOS or without hemorrhage, unspecified trimester: Secondary | ICD-10-CM

## 2023-05-06 DIAGNOSIS — Z2839 Other underimmunization status: Secondary | ICD-10-CM

## 2023-05-06 DIAGNOSIS — F32A Depression, unspecified: Secondary | ICD-10-CM

## 2023-05-06 DIAGNOSIS — O09899 Supervision of other high risk pregnancies, unspecified trimester: Secondary | ICD-10-CM

## 2023-06-03 ENCOUNTER — Ambulatory Visit: Payer: No Typology Code available for payment source | Admitting: Obstetrics and Gynecology

## 2023-06-03 ENCOUNTER — Encounter: Payer: Self-pay | Admitting: Obstetrics and Gynecology

## 2023-06-03 VITALS — BP 118/77 | HR 83 | Wt 156.0 lb

## 2023-06-03 DIAGNOSIS — Z3A24 24 weeks gestation of pregnancy: Secondary | ICD-10-CM

## 2023-06-03 DIAGNOSIS — O26899 Other specified pregnancy related conditions, unspecified trimester: Secondary | ICD-10-CM

## 2023-06-03 DIAGNOSIS — Z2839 Other underimmunization status: Secondary | ICD-10-CM

## 2023-06-03 DIAGNOSIS — O4440 Low lying placenta NOS or without hemorrhage, unspecified trimester: Secondary | ICD-10-CM

## 2023-06-03 DIAGNOSIS — O09899 Supervision of other high risk pregnancies, unspecified trimester: Secondary | ICD-10-CM

## 2023-06-03 DIAGNOSIS — Z3490 Encounter for supervision of normal pregnancy, unspecified, unspecified trimester: Secondary | ICD-10-CM

## 2023-06-03 DIAGNOSIS — Z6791 Unspecified blood type, Rh negative: Secondary | ICD-10-CM

## 2023-06-03 NOTE — Progress Notes (Signed)
   PRENATAL VISIT NOTE  Subjective:  Sonya West is a 32 y.o. G1P0000 at [redacted]w[redacted]d being seen today for ongoing prenatal care.  She is currently monitored for the following issues for this low-risk pregnancy and has Asthma; Attention deficit disorder; Supervision of normal pregnancy; Rubella non-immune status, antepartum; Depression; Rh negative state in antepartum period; and Low-lying placenta on their problem list.  Patient reports no complaints today but a few weeks ago had episode of vaginal bleeding. No prior intercourse. No pain. Also notes fatigue.  Contractions: Not present. Vag. Bleeding: None.  Movement: Present. Denies leaking of fluid.   The following portions of the patient's history were reviewed and updated as appropriate: allergies, current medications, past family history, past medical history, past social history, past surgical history and problem list.   Objective:   Vitals:   06/03/23 1537  BP: 118/77  Pulse: 83  Weight: 156 lb (70.8 kg)    Fetal Status: Fetal Heart Rate (bpm): 141   Movement: Present     General:  Alert, oriented and cooperative. Patient is in no acute distress.  Skin: Skin is warm and dry. No rash noted.   Cardiovascular: Normal heart rate noted  Respiratory: Normal respiratory effort, no problems with respiration noted  Abdomen: Soft, gravid, appropriate for gestational age.  Pain/Pressure: Absent     Pelvic: Cervical exam deferred        Extremities: Normal range of motion.  Edema: None  Mental Status: Normal mood and affect. Normal behavior. Normal judgment and thought content.   Assessment and Plan:  Pregnancy: G1P0000 at [redacted]w[redacted]d 1. Encounter for supervision of normal pregnancy, antepartum, unspecified gravidity 28 wk labs next time.  Reviewed VB precautions Discussed tdap in pregnancy.   2. Rubella non-immune status, antepartum Offer MMR after delivery  3. Rh negative state in antepartum period Rhogam at 28w  4. Low-lying placenta 1.8  cm from os. No f/u required per MFM.   5. Pregnancy with 24 completed weeks gestation   Preterm labor symptoms and general obstetric precautions including but not limited to vaginal bleeding, contractions, leaking of fluid and fetal movement were reviewed in detail with the patient. Please refer to After Visit Summary for other counseling recommendations.   Return in about 4 weeks (around 07/01/2023) for OB VISIT, MD or APP, 2 hr GTT/28w labs.  Future Appointments  Date Time Provider Department Center  07/06/2023  8:10 AM Rasch, Harolyn Rutherford, NP [facility]  10/14/2023  8:30 AM Christen Butter, NP [facility] None    Milas Hock, MD

## 2023-06-03 NOTE — Progress Notes (Signed)
Pt declined flu shot °

## 2023-07-06 ENCOUNTER — Ambulatory Visit: Payer: No Typology Code available for payment source | Admitting: Obstetrics and Gynecology

## 2023-07-06 VITALS — BP 117/75 | HR 106 | Wt 159.0 lb

## 2023-07-06 DIAGNOSIS — Z23 Encounter for immunization: Secondary | ICD-10-CM | POA: Diagnosis not present

## 2023-07-06 DIAGNOSIS — Z3490 Encounter for supervision of normal pregnancy, unspecified, unspecified trimester: Secondary | ICD-10-CM | POA: Diagnosis not present

## 2023-07-06 DIAGNOSIS — O4440 Low lying placenta NOS or without hemorrhage, unspecified trimester: Secondary | ICD-10-CM

## 2023-07-06 MED ORDER — RHO D IMMUNE GLOBULIN 1500 UNIT/2ML IJ SOSY
300.0000 ug | PREFILLED_SYRINGE | Freq: Once | INTRAMUSCULAR | Status: AC
Start: 2023-07-06 — End: 2023-07-06
  Administered 2023-07-06: 300 ug via INTRAMUSCULAR

## 2023-07-06 NOTE — Progress Notes (Signed)
   PRENATAL VISIT NOTE  Subjective:  Sonya West is a 32 y.o. G1P0000 at [redacted]w[redacted]d being seen today for ongoing prenatal care.  She is currently monitored for the following issues for this low-risk pregnancy and has Asthma; Attention deficit disorder; Supervision of normal pregnancy; Rubella non-immune status, antepartum; Depression; Rh negative state in antepartum period; and Low-lying placenta on their problem list.  Patient reports no complaints.  Contractions: Not present. Vag. Bleeding: None.  Movement: Present. Denies leaking of fluid.   The following portions of the patient's history were reviewed and updated as appropriate: allergies, current medications, past family history, past medical history, past social history, past surgical history and problem list.   Objective:   Vitals:   07/06/23 0803  BP: 117/75  Pulse: (!) 106  Weight: 159 lb (72.1 kg)    Fetal Status: Fetal Heart Rate (bpm): 132 Fundal Height: 28 cm Movement: Present     General:  Alert, oriented and cooperative. Patient is in no acute distress.  Skin: Skin is warm and dry. No rash noted.   Cardiovascular: Normal heart rate noted  Respiratory: Normal respiratory effort, no problems with respiration noted  Abdomen: Soft, gravid, appropriate for gestational age.  Pain/Pressure: Absent     Pelvic: Cervical exam deferred        Extremities: Normal range of motion.  Edema: None  Mental Status: Normal mood and affect. Normal behavior. Normal judgment and thought content.   Assessment and Plan:  Pregnancy: G1P0000 at [redacted]w[redacted]d 1. Encounter for supervision of normal pregnancy, antepartum, unspecified gravidity  - Glucose Tolerance, 2 Hours w/1 Hour - Antibody screen - CBC - HIV antibody (with reflex) - RPR - rho (d) immune globulin (RHIG/RHOPHYLAC) injection 300 mcg - Tdap vaccine greater than or equal to 7yo IM - TSH - some left sciatic pain that comes and goes. Able to tolerate it.  Preterm labor symptoms and  general obstetric precautions including but not limited to vaginal bleeding, contractions, leaking of fluid and fetal movement were reviewed in detail with the patient. Please refer to After Visit Summary for other counseling recommendations.   No follow-ups on file.  Future Appointments  Date Time Provider Department Center  10/14/2023  8:30 AM Christen Butter, NP [facility] None    Venia Carbon, NP

## 2023-07-07 LAB — ANTIBODY SCREEN: Antibody Screen: NEGATIVE

## 2023-07-07 LAB — GLUCOSE TOLERANCE, 2 HOURS W/ 1HR
Glucose, 1 hour: 100 mg/dL (ref 70–179)
Glucose, 2 hour: 86 mg/dL (ref 70–152)
Glucose, Fasting: 75 mg/dL (ref 70–91)

## 2023-07-07 LAB — CBC
Hematocrit: 38.3 % (ref 34.0–46.6)
Hemoglobin: 12.7 g/dL (ref 11.1–15.9)
MCH: 30.6 pg (ref 26.6–33.0)
MCHC: 33.2 g/dL (ref 31.5–35.7)
MCV: 92 fL (ref 79–97)
Platelets: 240 10*3/uL (ref 150–450)
RBC: 4.15 x10E6/uL (ref 3.77–5.28)
RDW: 12.8 % (ref 11.7–15.4)
WBC: 8.8 10*3/uL (ref 3.4–10.8)

## 2023-07-07 LAB — HIV ANTIBODY (ROUTINE TESTING W REFLEX): HIV Screen 4th Generation wRfx: NONREACTIVE

## 2023-07-07 LAB — TSH: TSH: 2.21 u[IU]/mL (ref 0.450–4.500)

## 2023-07-07 LAB — RPR: RPR Ser Ql: NONREACTIVE

## 2023-07-19 ENCOUNTER — Encounter: Payer: Self-pay | Admitting: Obstetrics and Gynecology

## 2023-07-19 ENCOUNTER — Ambulatory Visit (INDEPENDENT_AMBULATORY_CARE_PROVIDER_SITE_OTHER): Payer: No Typology Code available for payment source | Admitting: Obstetrics and Gynecology

## 2023-07-19 VITALS — BP 118/73 | HR 94 | Wt 168.0 lb

## 2023-07-19 DIAGNOSIS — Z6791 Unspecified blood type, Rh negative: Secondary | ICD-10-CM

## 2023-07-19 DIAGNOSIS — F32A Depression, unspecified: Secondary | ICD-10-CM

## 2023-07-19 DIAGNOSIS — O26899 Other specified pregnancy related conditions, unspecified trimester: Secondary | ICD-10-CM

## 2023-07-19 DIAGNOSIS — Z3490 Encounter for supervision of normal pregnancy, unspecified, unspecified trimester: Secondary | ICD-10-CM

## 2023-07-19 DIAGNOSIS — Z3A30 30 weeks gestation of pregnancy: Secondary | ICD-10-CM

## 2023-07-19 DIAGNOSIS — O4440 Low lying placenta NOS or without hemorrhage, unspecified trimester: Secondary | ICD-10-CM

## 2023-07-19 DIAGNOSIS — Z2839 Other underimmunization status: Secondary | ICD-10-CM

## 2023-07-19 DIAGNOSIS — O09899 Supervision of other high risk pregnancies, unspecified trimester: Secondary | ICD-10-CM

## 2023-07-19 NOTE — Progress Notes (Signed)
   PRENATAL VISIT NOTE  Subjective:  Sonya West is a 32 y.o. G1P0000 at [redacted]w[redacted]d being seen today for ongoing prenatal care.  She is currently monitored for the following issues for this low-risk pregnancy and has Asthma; Attention deficit disorder; Supervision of normal pregnancy; Rubella non-immune status, antepartum; Depression; Rh negative state in antepartum period; and Low-lying placenta on their problem list.  Patient reports no complaints.  Contractions: Not present. Vag. Bleeding: None.  Movement: Present. Denies leaking of fluid.   The following portions of the patient's history were reviewed and updated as appropriate: allergies, current medications, past family history, past medical history, past social history, past surgical history and problem list.   Objective:   Vitals:   07/19/23 1413  BP: 118/73  Pulse: 94  Weight: 168 lb (76.2 kg)    Fetal Status: Fetal Heart Rate (bpm): 141 Fundal Height: 30 cm Movement: Present     General:  Alert, oriented and cooperative. Patient is in no acute distress.  Skin: Skin is warm and dry. No rash noted.   Cardiovascular: Normal heart rate noted  Respiratory: Normal respiratory effort, no problems with respiration noted  Abdomen: Soft, gravid, appropriate for gestational age.  Pain/Pressure: Absent     Pelvic: Cervical exam deferred        Extremities: Normal range of motion.  Edema: None  Mental Status: Normal mood and affect. Normal behavior. Normal judgment and thought content.   Assessment and Plan:  Pregnancy: G1P0000 at [redacted]w[redacted]d 1. Encounter for supervision of normal pregnancy, antepartum, unspecified gravidity (Primary) FH/BP wnl  2. Rubella non-immune status, antepartum MMR after delivery  3. Rh negative state in antepartum period S/p Rhogam  4. Low-lying placenta Does not require f/u per MFM but may wish to go.   5. Depression, unspecified depression type Doing well! Feels like since being pregnant she has been much  happier and doesn't feel depressed at all.   6. Pregnancy with 30 completed weeks gestation   Preterm labor symptoms and general obstetric precautions including but not limited to vaginal bleeding, contractions, leaking of fluid and fetal movement were reviewed in detail with the patient. Please refer to After Visit Summary for other counseling recommendations.   Return in about 2 weeks (around 08/02/2023) for OB VISIT, MD or APP.  Future Appointments  Date Time Provider Department Center  08/03/2023  2:10 PM Rasch, Harolyn Rutherford, NP [facility]  08/17/2023  2:50 PM Rasch, Harolyn Rutherford, NP [facility]  10/14/2023  8:30 AM Christen Butter, NP [facility] None    Milas Hock, MD

## 2023-07-28 NOTE — L&D Delivery Note (Addendum)
 OB/GYN Faculty Practice Delivery Note  Sonya West is a 33 y.o. G1P0000 s/p NSVD at [redacted]w[redacted]d. She was admitted for PROM.  ROM: 32h 63m with clear fluid GBS Status:  Negative/-- (02/04 1458) Maximum Maternal Temperature: 99.6 F  Labor Progress: Initial SVE: 2.5/60/-2 on 2/26. Pitocin started at 0645 on 2/27. Epidural at 1047. She then progressed to complete at 1449.   Delivery Date/Time: 09/23/23 1748 Delivery: Called to room and patient was complete and pushing. Head delivered OA to ROA. Double nuchal and arm cord present. Reduced after delivery. Shoulder and body delivered in usual fashion. Infant with spontaneous cry, placed on mother's abdomen, dried and stimulated. Cord clamped x 2 after 1-minute delay, and cut by FOB. Cord blood drawn. Patient had brisk bleeding immediately after delivery, placenta delivered quickly with cord traction. Trailing membranes present. Lower uterine segment sweep clear. Pitocin initiated and later given TXA as well. Fundus firm with massage and Pitocin. Labia, perineum, vagina, and cervix inspected with hemostatic right labial laceration not requiring repair and vaginal laceration. Most of bleeding appeared to be from vaginal laceration, so this was repaired in a running fashion with 3-0 vircyl. Hemostatic prior to my departure from room. Mom and baby to postpartum. Baby Weight: 3190 g  Placenta: 3 vessel, intact with trailing membrane which was removed. Sent to L&D Complications: Prolonged rupture Lacerations: hemostatic right labial laceration not requiring repair and vaginal laceration which was repaired EBL: 652 mL Analgesia: Epidural   Infant:  APGAR (1 MIN): 9 APGAR (5 MINS): 9  Sonya Dice, MD Family Medicine Resident, PGY-1 09/23/2023, 6:12 PM  Attestation of Supervision of Student: I was present, gloved, and participated in the entirety of delivery and third stage. I delivered placenta and performed repair given brisk bleeding after  delivery.  Sonya Gavel, MD OB Fellow 09/23/2023 7:34 PM

## 2023-08-03 ENCOUNTER — Ambulatory Visit (INDEPENDENT_AMBULATORY_CARE_PROVIDER_SITE_OTHER): Payer: No Typology Code available for payment source | Admitting: Obstetrics and Gynecology

## 2023-08-03 VITALS — BP 114/77 | HR 74 | Wt 168.0 lb

## 2023-08-03 DIAGNOSIS — Z3490 Encounter for supervision of normal pregnancy, unspecified, unspecified trimester: Secondary | ICD-10-CM

## 2023-08-03 NOTE — Progress Notes (Signed)
   PRENATAL VISIT NOTE  Subjective:  Sonya West is a 33 y.o. G1P0000 at [redacted]w[redacted]d being seen today for ongoing prenatal care.  She is currently monitored for the following issues for this low-risk pregnancy and has Asthma; Attention deficit disorder; Supervision of normal pregnancy; Rubella non-immune status, antepartum; Depression; Rh negative state in antepartum period; and Low-lying placenta on their problem list.  Patient reports no complaints.  Contractions: Not present. Vag. Bleeding: None.  Movement: Present. Denies leaking of fluid.   The following portions of the patient's history were reviewed and updated as appropriate: allergies, current medications, past family history, past medical history, past social history, past surgical history and problem list.   Objective:   Vitals:   08/03/23 1349  BP: 114/77  Pulse: 74  Weight: 168 lb (76.2 kg)    Fetal Status: Fetal Heart Rate (bpm): 133 Fundal Height: 32 cm Movement: Present     General:  Alert, oriented and cooperative. Patient is in no acute distress.  Skin: Skin is warm and dry. No rash noted.   Cardiovascular: Normal heart rate noted  Respiratory: Normal respiratory effort, no problems with respiration noted  Abdomen: Soft, gravid, appropriate for gestational age.  Pain/Pressure: Absent     Pelvic: Cervical exam deferred        Extremities: Normal range of motion.  Edema: None  Mental Status: Normal mood and affect. Normal behavior. Normal judgment and thought content.   Assessment and Plan:  Pregnancy: G1P0000 at [redacted]w[redacted]d   1. Encounter for supervision of normal pregnancy, antepartum, unspecified gravidity (Primary)  Doing well 2 hour GTT normal F/u MFM scheduled 1/20- patient plans to keep this for reassurance. Has some spotting a few weeks ago, none since.    Preterm labor symptoms and general obstetric precautions including but not limited to vaginal bleeding, contractions, leaking of fluid and fetal movement were  reviewed in detail with the patient. Please refer to After Visit Summary for other counseling recommendations.   No follow-ups on file.  Future Appointments  Date Time Provider Department Center  08/03/2023  2:10 PM Thresia Ramanathan, Delon FERNS, NP [facility]  08/16/2023  2:15 PM [facility] NURSE [facility]  08/16/2023  2:30 PM [facility] US5 [facility]  08/17/2023  2:50 PM Jacklyn Branan, Delon FERNS, NP [facility]  10/14/2023  8:30 AM Willo Mini, NP [facility] None    Delon Emms, NP

## 2023-08-08 ENCOUNTER — Other Ambulatory Visit: Payer: Self-pay | Admitting: Medical-Surgical

## 2023-08-08 DIAGNOSIS — F322 Major depressive disorder, single episode, severe without psychotic features: Secondary | ICD-10-CM

## 2023-08-08 DIAGNOSIS — R45851 Suicidal ideations: Secondary | ICD-10-CM

## 2023-08-16 ENCOUNTER — Encounter: Payer: Self-pay | Admitting: *Deleted

## 2023-08-16 ENCOUNTER — Other Ambulatory Visit: Payer: Self-pay

## 2023-08-16 ENCOUNTER — Ambulatory Visit: Payer: No Typology Code available for payment source | Attending: Obstetrics and Gynecology

## 2023-08-16 ENCOUNTER — Ambulatory Visit: Payer: No Typology Code available for payment source | Admitting: *Deleted

## 2023-08-16 VITALS — BP 123/72 | HR 75

## 2023-08-16 DIAGNOSIS — Z8279 Family history of other congenital malformations, deformations and chromosomal abnormalities: Secondary | ICD-10-CM | POA: Diagnosis not present

## 2023-08-16 DIAGNOSIS — O352XX0 Maternal care for (suspected) hereditary disease in fetus, not applicable or unspecified: Secondary | ICD-10-CM | POA: Diagnosis not present

## 2023-08-16 DIAGNOSIS — Z3490 Encounter for supervision of normal pregnancy, unspecified, unspecified trimester: Secondary | ICD-10-CM

## 2023-08-16 DIAGNOSIS — O360130 Maternal care for anti-D [Rh] antibodies, third trimester, not applicable or unspecified: Secondary | ICD-10-CM | POA: Insufficient documentation

## 2023-08-16 DIAGNOSIS — O99343 Other mental disorders complicating pregnancy, third trimester: Secondary | ICD-10-CM

## 2023-08-16 DIAGNOSIS — Z3A34 34 weeks gestation of pregnancy: Secondary | ICD-10-CM | POA: Diagnosis not present

## 2023-08-16 DIAGNOSIS — O4440 Low lying placenta NOS or without hemorrhage, unspecified trimester: Secondary | ICD-10-CM | POA: Diagnosis present

## 2023-08-16 DIAGNOSIS — O99513 Diseases of the respiratory system complicating pregnancy, third trimester: Secondary | ICD-10-CM | POA: Insufficient documentation

## 2023-08-16 DIAGNOSIS — J45909 Unspecified asthma, uncomplicated: Secondary | ICD-10-CM | POA: Insufficient documentation

## 2023-08-16 DIAGNOSIS — F99 Mental disorder, not otherwise specified: Secondary | ICD-10-CM

## 2023-08-16 DIAGNOSIS — O4443 Low lying placenta NOS or without hemorrhage, third trimester: Secondary | ICD-10-CM | POA: Insufficient documentation

## 2023-08-17 ENCOUNTER — Ambulatory Visit (INDEPENDENT_AMBULATORY_CARE_PROVIDER_SITE_OTHER): Payer: No Typology Code available for payment source | Admitting: Obstetrics and Gynecology

## 2023-08-17 VITALS — BP 115/78 | HR 84 | Wt 169.0 lb

## 2023-08-17 DIAGNOSIS — Z3A34 34 weeks gestation of pregnancy: Secondary | ICD-10-CM

## 2023-08-17 DIAGNOSIS — Z3490 Encounter for supervision of normal pregnancy, unspecified, unspecified trimester: Secondary | ICD-10-CM

## 2023-08-17 DIAGNOSIS — Z3483 Encounter for supervision of other normal pregnancy, third trimester: Secondary | ICD-10-CM

## 2023-08-17 DIAGNOSIS — O4440 Low lying placenta NOS or without hemorrhage, unspecified trimester: Secondary | ICD-10-CM

## 2023-08-17 NOTE — Progress Notes (Signed)
   PRENATAL VISIT NOTE  Subjective:  Sonya West is a 33 y.o. G1P0000 at [redacted]w[redacted]d being seen today for ongoing prenatal care.  She is currently monitored for the following issues for this low-risk pregnancy and has Asthma; Attention deficit disorder; Supervision of normal pregnancy; Rubella non-immune status, antepartum; Depression; Rh negative state in antepartum period; and Low-lying placenta on their problem list.  Patient reports no complaints.  Contractions: Not present. Vag. Bleeding: None.  Movement: Present. Denies leaking of fluid.   The following portions of the patient's history were reviewed and updated as appropriate: allergies, current medications, past family history, past medical history, past social history, past surgical history and problem list.   Objective:   Vitals:   08/17/23 1439  BP: 115/78  Pulse: 84  Weight: 169 lb (76.7 kg)    Fetal Status: Fetal Heart Rate (bpm): 133 Fundal Height: 35 cm Movement: Present     General:  Alert, oriented and cooperative. Patient is in no acute distress.  Skin: Skin is warm and dry. No rash noted.   Cardiovascular: Normal heart rate noted  Respiratory: Normal respiratory effort, no problems with respiration noted  Abdomen: Soft, gravid, appropriate for gestational age.  Pain/Pressure: Absent     Pelvic: Cervical exam deferred        Extremities: Normal range of motion.  Edema: None  Mental Status: Normal mood and affect. Normal behavior. Normal judgment and thought content.   Assessment and Plan:  Pregnancy: G1P0000 at [redacted]w[redacted]d   1. Low-lying placenta (Primary)  - Resolved.   2. Encounter for supervision of normal pregnancy, antepartum, unspecified gravidity  - Doing well  - discussed MAU and reasons to present.    Preterm labor symptoms and general obstetric precautions including but not limited to vaginal bleeding, contractions, leaking of fluid and fetal movement were reviewed in detail with the patient. Please refer  to After Visit Summary for other counseling recommendations.   No follow-ups on file.  Future Appointments  Date Time Provider Department Center  08/31/2023  3:10 PM Aigner Horseman, Harolyn Rutherford, NP [facility]  09/07/2023  3:10 PM Dody Smartt, Harolyn Rutherford, NP [facility]  09/14/2023  3:10 PM Emberlin Verner, Harolyn Rutherford, NP [facility]  09/21/2023  3:10 PM Jacki Couse, Harolyn Rutherford, NP [facility]  10/14/2023  8:30 AM Christen Butter, NP [facility] None    Venia Carbon, NP

## 2023-08-31 ENCOUNTER — Ambulatory Visit (INDEPENDENT_AMBULATORY_CARE_PROVIDER_SITE_OTHER): Payer: No Typology Code available for payment source | Admitting: Obstetrics and Gynecology

## 2023-08-31 ENCOUNTER — Other Ambulatory Visit (HOSPITAL_COMMUNITY)
Admission: RE | Admit: 2023-08-31 | Discharge: 2023-08-31 | Disposition: A | Discharge status: Home / Self Care | Payer: No Typology Code available for payment source | Source: Ambulatory Visit | Attending: Obstetrics and Gynecology | Admitting: Obstetrics and Gynecology

## 2023-08-31 VITALS — BP 123/81 | HR 92 | Wt 168.0 lb

## 2023-08-31 DIAGNOSIS — Z3490 Encounter for supervision of normal pregnancy, unspecified, unspecified trimester: Secondary | ICD-10-CM

## 2023-08-31 NOTE — Progress Notes (Signed)
   PRENATAL VISIT NOTE  Subjective:  Sonya West is a 33 y.o. G1P0000 at [redacted]w[redacted]d being seen today for ongoing prenatal care.  She is currently monitored for the following issues for this low-risk pregnancy and has Asthma; Attention deficit disorder; Supervision of normal pregnancy; Rubella non-immune status, antepartum; Depression; Rh negative state in antepartum period; and Low-lying placenta on their problem list.  Patient reports no complaints.  Contractions: Not present. Vag. Bleeding: None.  Movement: Present. Denies leaking of fluid.   The following portions of the patient's history were reviewed and updated as appropriate: allergies, current medications, past family history, past medical history, past social history, past surgical history and problem list.   Objective:   Vitals:   08/31/23 1457  BP: 123/81  Pulse: 92  Weight: 168 lb (76.2 kg)    Fetal Status: Fetal Heart Rate (bpm): 152 Fundal Height: 35 cm Movement: Present     General:  Alert, oriented and cooperative. Patient is in no acute distress.  Skin: Skin is warm and dry. No rash noted.   Cardiovascular: Normal heart rate noted  Respiratory: Normal respiratory effort, no problems with respiration noted  Abdomen: Soft, gravid, appropriate for gestational age.  Pain/Pressure: Absent     Pelvic: Cervical exam deferred        Extremities: Normal range of motion.  Edema: None  Mental Status: Normal mood and affect. Normal behavior. Normal judgment and thought content.   Assessment and Plan:  Pregnancy: G1P0000 at [redacted]w[redacted]d 1. Encounter for supervision of normal pregnancy, antepartum, unspecified gravidity (Primary)  - Culture, beta strep (group b only) - Cervicovaginal ancillary only( [place])  Preterm labor symptoms and general obstetric precautions including but not limited to vaginal bleeding, contractions, leaking of fluid and fetal movement were reviewed in detail with the patient. Please refer to After Visit  Summary for other counseling recommendations.   No follow-ups on file.  Future Appointments  Date Time Provider Department Center  09/07/2023  3:10 PM Abbeygail Igoe, Delon FERNS, NP [facility]  09/14/2023  3:10 PM Izzabella Besse, Delon FERNS, NP [facility]  09/21/2023  3:10 PM Lasharn Bufkin, Delon FERNS, NP [facility]  10/14/2023  8:30 AM Willo Mini, NP [facility] None    Delon Emms, NP

## 2023-09-01 LAB — CERVICOVAGINAL ANCILLARY ONLY
Chlamydia: NEGATIVE
Comment: NEGATIVE
Comment: NORMAL
Neisseria Gonorrhea: NEGATIVE

## 2023-09-04 LAB — CULTURE, BETA STREP (GROUP B ONLY): Strep Gp B Culture: NEGATIVE

## 2023-09-07 ENCOUNTER — Ambulatory Visit: Payer: No Typology Code available for payment source | Admitting: Obstetrics and Gynecology

## 2023-09-07 VITALS — BP 127/78 | HR 112 | Temp 98.1°F | Wt 171.0 lb

## 2023-09-07 DIAGNOSIS — J4520 Mild intermittent asthma, uncomplicated: Secondary | ICD-10-CM | POA: Diagnosis not present

## 2023-09-07 DIAGNOSIS — Z3403 Encounter for supervision of normal first pregnancy, third trimester: Secondary | ICD-10-CM | POA: Diagnosis not present

## 2023-09-07 NOTE — Progress Notes (Signed)
Pulse Ox 98%

## 2023-09-07 NOTE — Progress Notes (Signed)
   PRENATAL VISIT NOTE  Subjective:  Sonya West is a 33 y.o. G1P0000 at [redacted]w[redacted]d being seen today for ongoing prenatal care.  She is currently monitored for the following issues for this low-risk pregnancy and has Asthma; Attention deficit disorder; Supervision of normal pregnancy; Rubella non-immune status, antepartum; Depression; Rh negative state in antepartum period; and Low-lying placenta on their problem list.  Patient reports  shortness of breath .  Contractions: Not present. Vag. Bleeding: None.  Movement: Present. Denies leaking of fluid.   The following portions of the patient's history were reviewed and updated as appropriate: allergies, current medications, past family history, past medical history, past social history, past surgical history and problem list.   Objective:   Vitals:   09/07/23 1502  BP: 127/78  Pulse: (!) 112  Temp: 98.1 F (36.7 C)  Weight: 171 lb (77.6 kg)    Fetal Status: Fetal Heart Rate (bpm): 142   Movement: Present     General:  Alert, oriented and cooperative. Patient is in no acute distress.  Skin: Skin is warm and dry. No rash noted.   Cardiovascular: Normal heart rate noted  Respiratory: Normal respiratory effort, no problems with respiration noted. Lungs clear bilaterally  Abdomen: Soft, gravid, appropriate for gestational age.  Pain/Pressure: Absent     Pelvic: Cervical exam deferred        Extremities: Normal range of motion.  Edema: None  Mental Status: Normal mood and affect. Normal behavior. Normal judgment and thought content.   Assessment and Plan:  Pregnancy: G1P0000 at [redacted]w[redacted]d  1. Encounter for supervision of normal first pregnancy in third trimester (Primary) -BP WNL, feeling good movement, no contractions.   2. Mild intermittent asthma without complication -reports cough & chest tightness since waking up this morning. Used Albuterol inhaler with some improvement. No sick contacts or other symptoms. Discussed possible new viral  illness and use Albuterol as needed. Advised patient to reach out if Albuterol isn't enough. Precautions given.   Term labor symptoms and general obstetric precautions including but not limited to vaginal bleeding, contractions, leaking of fluid and fetal movement were reviewed in detail with the patient. Please refer to After Visit Summary for other counseling recommendations.   No follow-ups on file.  Future Appointments  Date Time Provider Department Center  09/14/2023  3:10 PM Rasch, Harolyn Rutherford, NP [facility]  09/21/2023  3:10 PM Rasch, Harolyn Rutherford, NP [facility]  10/14/2023  8:30 AM Christen Butter, NP [facility] None    Marylynn Pearson, RN

## 2023-09-14 ENCOUNTER — Ambulatory Visit (INDEPENDENT_AMBULATORY_CARE_PROVIDER_SITE_OTHER): Payer: No Typology Code available for payment source | Admitting: Obstetrics and Gynecology

## 2023-09-14 VITALS — BP 122/84 | HR 86 | Wt 171.0 lb

## 2023-09-14 DIAGNOSIS — Z3403 Encounter for supervision of normal first pregnancy, third trimester: Secondary | ICD-10-CM | POA: Diagnosis not present

## 2023-09-14 DIAGNOSIS — Z3A38 38 weeks gestation of pregnancy: Secondary | ICD-10-CM

## 2023-09-14 DIAGNOSIS — Z3490 Encounter for supervision of normal pregnancy, unspecified, unspecified trimester: Secondary | ICD-10-CM
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
## 2023-09-21 ENCOUNTER — Telehealth: Payer: Self-pay | Admitting: *Deleted

## 2023-09-21 ENCOUNTER — Ambulatory Visit: Payer: No Typology Code available for payment source | Admitting: Obstetrics and Gynecology

## 2023-09-21 VITALS — BP 125/85 | HR 91 | Wt 174.0 lb

## 2023-09-21 DIAGNOSIS — Z3490 Encounter for supervision of normal pregnancy, unspecified, unspecified trimester: Secondary | ICD-10-CM

## 2023-09-21 NOTE — Telephone Encounter (Signed)
 Reschedule for Wednesday, 09/29/2023 per Sonya West. Return in 1 week for OB f/u & NST. Ideally after Tuesday, 09/28/23.

## 2023-09-21 NOTE — Progress Notes (Signed)
   PRENATAL VISIT NOTE  Subjective:  Sonya West is a 33 y.o. G1P0000 at [redacted]w[redacted]d being seen today for ongoing prenatal care.  She is currently monitored for the following issues for this low-risk pregnancy and has Asthma; Attention deficit disorder; Supervision of normal pregnancy; Rubella non-immune status, antepartum; Depression; Rh negative state in antepartum period; and Low-lying placenta on their problem list.  Patient reports no complaints.  Contractions: Irritability. Vag. Bleeding: None.  Movement: Present. Denies leaking of fluid.   The following portions of the patient's history were reviewed and updated as appropriate: allergies, current medications, past family history, past medical history, past social history, past surgical history and problem list.   Objective:   Vitals:   09/21/23 1520  BP: 125/85  Pulse: 91  Weight: 174 lb (78.9 kg)    Fetal Status: Fetal Heart Rate (bpm): 125 Fundal Height: 38 cm Movement: Present  Presentation: Vertex  General:  Alert, oriented and cooperative. Patient is in no acute distress.  Skin: Skin is warm and dry. No rash noted.   Cardiovascular: Normal heart rate noted  Respiratory: Normal respiratory effort, no problems with respiration noted  Abdomen: Soft, gravid, appropriate for gestational age.  Pain/Pressure: Present     Pelvic: Cervical exam performed in the presence of a chaperone Dilation: 1.5 Effacement (%): Thick Station: -3  Extremities: Normal range of motion.  Edema: None  Mental Status: Normal mood and affect. Normal behavior. Normal judgment and thought content.   Assessment and Plan:  Pregnancy: G1P0000 at [redacted]w[redacted]d  1. Encounter for supervision of normal pregnancy, antepartum, unspecified gravidity (Primary)  - CBC; Standing - Type and screen; Standing - RPR; Standing   - NST with post dates visit next week - Patient would like to hold induction until 41 weeks. Orders placed.  - GBS negative.    Term labor symptoms  and general obstetric precautions including but not limited to vaginal bleeding, contractions, leaking of fluid and fetal movement were reviewed in detail with the patient. Please refer to After Visit Summary for other counseling recommendations.   No follow-ups on file.  Future Appointments  Date Time Provider Department Center  09/28/2023  3:50 PM Marketta Valadez, Harolyn Rutherford, NP [facility]  09/30/2023  7:00 AM [facility] SCHED ROOM [facility] None  10/14/2023  8:30 AM Christen Butter, NP [facility] None  11/02/2023  3:10 PM Davielle Lingelbach, Harolyn Rutherford, NP [facility]    Venia Carbon, NP

## 2023-09-22 ENCOUNTER — Encounter (HOSPITAL_COMMUNITY): Payer: Self-pay | Admitting: Obstetrics & Gynecology

## 2023-09-22 ENCOUNTER — Inpatient Hospital Stay (HOSPITAL_COMMUNITY)
Admission: AD | Admit: 2023-09-22 | Discharge: 2023-09-25 | DRG: 807 | Disposition: A | Discharge status: Home / Self Care | Payer: No Typology Code available for payment source | Attending: Obstetrics & Gynecology | Admitting: Obstetrics & Gynecology

## 2023-09-22 ENCOUNTER — Encounter (HOSPITAL_COMMUNITY): Payer: Self-pay

## 2023-09-22 ENCOUNTER — Telehealth (HOSPITAL_COMMUNITY): Payer: Self-pay | Admitting: *Deleted

## 2023-09-22 DIAGNOSIS — O4292 Full-term premature rupture of membranes, unspecified as to length of time between rupture and onset of labor: Secondary | ICD-10-CM | POA: Diagnosis present

## 2023-09-22 DIAGNOSIS — O4290 Premature rupture of membranes, unspecified as to length of time between rupture and onset of labor, unspecified weeks of gestation: Principal | ICD-10-CM

## 2023-09-22 DIAGNOSIS — O480 Post-term pregnancy: Secondary | ICD-10-CM | POA: Diagnosis not present

## 2023-09-22 DIAGNOSIS — Z87891 Personal history of nicotine dependence: Secondary | ICD-10-CM | POA: Diagnosis not present

## 2023-09-22 DIAGNOSIS — Z8249 Family history of ischemic heart disease and other diseases of the circulatory system: Secondary | ICD-10-CM | POA: Diagnosis not present

## 2023-09-22 DIAGNOSIS — O9952 Diseases of the respiratory system complicating childbirth: Secondary | ICD-10-CM | POA: Diagnosis present

## 2023-09-22 DIAGNOSIS — Z23 Encounter for immunization: Secondary | ICD-10-CM

## 2023-09-22 DIAGNOSIS — Z3A39 39 weeks gestation of pregnancy: Secondary | ICD-10-CM

## 2023-09-22 DIAGNOSIS — Z6791 Unspecified blood type, Rh negative: Secondary | ICD-10-CM | POA: Diagnosis not present

## 2023-09-22 DIAGNOSIS — Z3A40 40 weeks gestation of pregnancy: Secondary | ICD-10-CM | POA: Diagnosis not present

## 2023-09-22 DIAGNOSIS — J45909 Unspecified asthma, uncomplicated: Secondary | ICD-10-CM | POA: Diagnosis present

## 2023-09-22 DIAGNOSIS — Z3490 Encounter for supervision of normal pregnancy, unspecified, unspecified trimester: Secondary | ICD-10-CM

## 2023-09-22 DIAGNOSIS — O09899 Supervision of other high risk pregnancies, unspecified trimester: Secondary | ICD-10-CM

## 2023-09-22 DIAGNOSIS — O26893 Other specified pregnancy related conditions, third trimester: Secondary | ICD-10-CM | POA: Diagnosis present

## 2023-09-22 DIAGNOSIS — Z833 Family history of diabetes mellitus: Secondary | ICD-10-CM

## 2023-09-22 DIAGNOSIS — O4202 Full-term premature rupture of membranes, onset of labor within 24 hours of rupture: Secondary | ICD-10-CM | POA: Diagnosis not present

## 2023-09-22 DIAGNOSIS — O26899 Other specified pregnancy related conditions, unspecified trimester: Secondary | ICD-10-CM

## 2023-09-22 DIAGNOSIS — Z2839 Other underimmunization status: Secondary | ICD-10-CM

## 2023-09-22 LAB — CBC
HCT: 37.2 % (ref 36.0–46.0)
Hemoglobin: 13.1 g/dL (ref 12.0–15.0)
MCH: 30.9 pg (ref 26.0–34.0)
MCHC: 35.2 g/dL (ref 30.0–36.0)
MCV: 87.7 fL (ref 80.0–100.0)
Platelets: 181 10*3/uL (ref 150–400)
RBC: 4.24 MIL/uL (ref 3.87–5.11)
RDW: 14.6 % (ref 11.5–15.5)
WBC: 8.3 10*3/uL (ref 4.0–10.5)
nRBC: 0 % (ref 0.0–0.2)

## 2023-09-22 LAB — TYPE AND SCREEN
ABO/RH(D): AB NEG
Antibody Screen: NEGATIVE

## 2023-09-22 LAB — POCT FERN TEST: POCT Fern Test: POSITIVE

## 2023-09-22 LAB — RPR: RPR Ser Ql: NONREACTIVE

## 2023-09-22 MED ORDER — SOD CITRATE-CITRIC ACID 500-334 MG/5ML PO SOLN: 30.0000 mL | ORAL | Status: DC | PRN

## 2023-09-22 MED ORDER — OXYTOCIN-SODIUM CHLORIDE 30-0.9 UT/500ML-% IV SOLN: 2.5000 [IU]/h | INTRAVENOUS | Status: DC

## 2023-09-22 MED ORDER — OXYCODONE-ACETAMINOPHEN 5-325 MG PO TABS: 2.0000 | ORAL_TABLET | ORAL | Status: DC | PRN

## 2023-09-22 MED ORDER — SERTRALINE HCL 50 MG PO TABS
50.0000 mg | ORAL_TABLET | Freq: Every day | ORAL | Status: DC
Start: 2023-09-23 — End: 2023-09-22

## 2023-09-22 MED ORDER — ACETAMINOPHEN 325 MG PO TABS: 650.0000 mg | ORAL_TABLET | ORAL | Status: DC | PRN

## 2023-09-22 MED ORDER — OXYTOCIN BOLUS FROM INFUSION
333.0000 mL | Freq: Once | INTRAVENOUS | Status: AC
  Administered 2023-09-23: 333 mL via INTRAVENOUS

## 2023-09-22 MED ORDER — PRENATAL MULTIVITAMIN CH: 1.0000 | ORAL_TABLET | Freq: Every day | ORAL | Status: DC

## 2023-09-22 MED ORDER — LACTATED RINGERS IV SOLN: INTRAVENOUS | Status: AC

## 2023-09-22 MED ORDER — ONDANSETRON HCL 4 MG/2ML IJ SOLN
4.0000 mg | Freq: Four times a day (QID) | INTRAMUSCULAR | Status: DC | PRN
  Administered 2023-09-23: 4 mg via INTRAVENOUS
  Filled 2023-09-22: qty 2

## 2023-09-22 MED ORDER — LACTATED RINGERS IV SOLN
500.0000 mL | INTRAVENOUS | Status: AC | PRN
  Administered 2023-09-23 (×2): 500 mL via INTRAVENOUS

## 2023-09-22 MED ORDER — LIDOCAINE HCL (PF) 1 % IJ SOLN: 30.0000 mL | INTRAMUSCULAR | Status: DC | PRN

## 2023-09-22 MED ORDER — OXYCODONE-ACETAMINOPHEN 5-325 MG PO TABS: 1.0000 | ORAL_TABLET | ORAL | Status: DC | PRN

## 2023-09-22 MED ORDER — SERTRALINE HCL 50 MG PO TABS
50.0000 mg | ORAL_TABLET | Freq: Every day | ORAL | Status: DC
  Administered 2023-09-23 (×2): 50 mg via ORAL
  Filled 2023-09-22 (×3): qty 1

## 2023-09-22 NOTE — Telephone Encounter (Signed)
 Preadmission screen

## 2023-09-22 NOTE — Progress Notes (Signed)
 Sonya West is a 33 y.o. G1P0000 at [redacted]w[redacted]d.  Subjective: Reports contractions getting closer and stronger. Coping well.   Objective: BP 126/88   Pulse 100   Temp 98.6 F (37 C) (Oral)   Resp 16   Ht 5\' 2"  (1.575 m)   Wt 78.3 kg   LMP 12/21/2022   SpO2 99%   BMI 31.59 kg/m    FHT:  FHR: 145 bpm, variability: mod,  accelerations:  15x51,  decelerations:  none UC:   Irreg, mild-mod Dilation: 2.5 Effacement (%): 60 Station: -2 Presentation: Vertex Exam by:: V Symphany Fleissner CNM  Labs: Results for orders placed or performed during the hospital encounter of 09/22/23 (from the past 24 hours)  Fern Test     Status: Abnormal   Collection Time: 09/22/23 11:14 AM  Result Value Ref Range   POCT Fern Test Positive = ruptured amniotic membanes   CBC     Status: None   Collection Time: 09/22/23 11:22 AM  Result Value Ref Range   WBC 8.3 4.0 - 10.5 K/uL   RBC 4.24 3.87 - 5.11 MIL/uL   Hemoglobin 13.1 12.0 - 15.0 g/dL   HCT 16.1 09.6 - 04.5 %   MCV 87.7 80.0 - 100.0 fL   MCH 30.9 26.0 - 34.0 pg   MCHC 35.2 30.0 - 36.0 g/dL   RDW 40.9 81.1 - 91.4 %   Platelets 181 150 - 400 K/uL   nRBC 0.0 0.0 - 0.2 %  RPR     Status: None   Collection Time: 09/22/23 11:22 AM  Result Value Ref Range   RPR Ser Ql NON REACTIVE NON REACTIVE  Type and screen     Status: None   Collection Time: 09/22/23 11:30 AM  Result Value Ref Range   ABO/RH(D) AB NEG    Antibody Screen NEG    Sample Expiration      09/25/2023,2359 Performed at [facility], [address], [place], [place] [address]     Assessment / Plan: [redacted]w[redacted]d week IUP ROM x Hours: 6  Labor: Early, making some progress based on VE yesterday. Discussed expectant management vs augmentation and increasing risk of chorioamnionitis the longer water is broken. Pt prefers low intervention and does not want medication at this time. Offered nipple stimulation which pt is agreeable to.  Fetal Wellbeing:  Category I Pain Control:  Comfort  measures. Anticipated MOD:  SVD  Katrinka Blazing [place] 09/22/2023 4:15 PM

## 2023-09-22 NOTE — H&P (Signed)
 HPI: Sonya West is a 33 y.o. year old G62P0000 female at [redacted]w[redacted]d weeks gestation who presents to MAU reporting Spontaneous rupture of membranes, clear fluid at 10:30 and rare, mild contractions.   NURSING  PROVIDER  Office Location [place] Dating by U/S at 8 wks  [facility] Model Traditional Anatomy [facility] follow up US 08/16/23  Initiated care at  11wks                Language  English              LAB RESULTS   Support Person [facility] [employer] NIPS: LR female AFP: Negative    NT/IT (FT only)     Carrier Screen Horizon: neg  Rhogam   Done 07/06/23 A1C/GTT Early:  Third trimester: normal 2 hour GTT  Flu Vaccine Declined    TDaP Vaccine  Done 07/06/23 Blood Type AB/Negative/-- (08/08 1509)  Covid Vaccine  Antibody Negative (08/08 1509)    Rubella <0.90 (08/08 1509)  Feeding Plan breast RPR Non Reactive (08/08 1509)  Contraception Condoms HBsAg Negative (08/08 1509)  Circumcision No HIV Non Reactive (08/08 1509)  Pediatrician  [facility] HCVAb Non Reactive (08/08 1509)  Prenatal Classes       Pap Diagnosis  Date Value Ref Range Status  09/23/2020   Final   - Negative for intraepithelial lesion or malignancy (NILM)    BTLConsent  GC/CT Initial:   36wks:    VBAC  Consent  GBS  Neg For PCN allergy, check sensitivities        DME Rx [ ]  BP cuff [ ]  Weight Scale Waterbirth  [ ]  Class [ ]  Consent [ ]  CNM visit  PHQ9 & GAD7 [  x] new OB [ x ] 28 weeks  [  ] 36 weeks Induction  [ ]  Orders Entered [ ] Foley Y/N    OB History     Gravida  1   Para  0   Term  0   Preterm  0   AB  0   Living  0      SAB  0   IAB  0   Ectopic  0   Multiple  0   Live Births  0          Past Medical History:  Diagnosis Date   ADHD    Allergy    Asthma    Depression, major, single episode, severe (HCC) 04/22/2021   Dysmenorrhea    Female pattern hair loss    Past Surgical History:  Procedure Laterality Date   WISDOM TOOTH EXTRACTION     Family History: family history  includes Asthma in her father and mother; Cancer in her paternal grandfather; Diabetes in her paternal grandfather; Heart attack in her maternal grandfather; Heart disease in her maternal grandfather. Social History:  reports that she quit smoking about 11 years ago. Her smoking use included cigarettes. She has never used smokeless tobacco. She reports that she does not currently use alcohol. She reports that she does not use drugs.     Maternal Diabetes: No Genetic Screening: Normal Maternal Ultrasounds/Referrals: Other: low lying placenta, resolved Fetal Ultrasounds or other Referrals:  None Maternal Substance Abuse:  No Significant Maternal Medications:  None Significant Maternal Lab Results:  Group B Strep negative and Rh negative Number of Prenatal Visits:greater than 3 verified prenatal visits Maternal Vaccinations:TDap Other Comments:  None  Review of Systems  Constitutional:  Negative for chills and fever.  Gastrointestinal:  Positive  for abdominal pain (rare, mild contractions).  Genitourinary:  Negative for vaginal bleeding.       LOF, clear   Maternal Medical History:  Reason for admission: Rupture of membranes.   Contractions: Frequency: rare.   Perceived severity is mild.   Fetal activity: Perceived fetal activity is normal.   Prenatal complications: No bleeding or PIH.   Prenatal Complications - Diabetes: none.   Exam by:: H. Flippin,RN Blood pressure 121/84, pulse (!) 107, temperature 98.7 F (37.1 C), temperature source Oral, resp. rate 15, height 5\' 2"  (1.575 m), weight 78.3 kg, last menstrual period 12/21/2022, SpO2 99%. Maternal Exam:  Uterine Assessment: Contraction strength is mild.  Abdomen: Patient reports no abdominal tenderness. Fetal presentation: vertex Introitus: Ferning test: positive.  Amniotic fluid character: clear.   Fetal Exam Fetal Monitor Review: Baseline rate: 125.  Variability: moderate (6-25 bpm).   Pattern: accelerations present and  no decelerations.   Fetal State Assessment: Category I - tracings are normal.   Physical Exam Constitutional:      General: She is not in acute distress.    Appearance: She is not ill-appearing.  Cardiovascular:     Rate and Rhythm: Normal rate.  Pulmonary:     Effort: Pulmonary effort is normal. No respiratory distress.  Abdominal:     Palpations: Abdomen is soft.     Tenderness: There is no abdominal tenderness.  Musculoskeletal:     Right lower leg: No edema.     Left lower leg: No edema.  Skin:    General: Skin is warm and dry.  Neurological:     General: No focal deficit present.     Mental Status: She is alert and oriented to person, place, and time.  Psychiatric:        Mood and Affect: Mood normal.     Prenatal labs: ABO, Rh: AB/Negative/-- (08/08 1509) Antibody: Negative (12/10 0903) Rubella: <0.90 (08/08 1509) RPR: Non Reactive (12/10 0903)  HBsAg: Negative (08/08 1509)  HIV: Non Reactive (12/10 0903)  GBS: Negative/-- (02/04 1458)   Assessment: 1. Labor: Early/PROM 2. Fetal Wellbeing: Category I  3. Pain Control: Comfort measures 4. GBS: Neg 5. 39.6 week IUP 6. Rh Neg 7. Rubella Non-immune  Plan:  1. Admit to BS per consult with MD 2. Routine L&D orders 3. Analgesia/anesthesia PRN  4. Rhophylac PRN 5. Offer MMR PP   Dorathy Kinsman 09/22/2023, 11:28 AM

## 2023-09-22 NOTE — Progress Notes (Signed)
 Patient ID: Sonya West, female   DOB: 1990/11/28, 33 y.o.   MRN: [id number]  In to introduce myself to pt and family; had PROM at 0930 (per pt) with mild ctx since; reports had some stronger ctx w nipple stim  VSS, afebrile IA FHTs 120s w accels Ctx 'every few minutes' Cx deferred  IUP@39 .6wks PROM x 11h GBS-  Discussion re options with PROM: offered continued expectant management with ideas to encourage ctx (more nipple stim; shower with chest facing water stream) vs Pitocin; pt prefers to wait on Pitocin until she is approaching the 24h mark of PROM (0930 2/27); family is aware of increased risk of chorio, and they understand that we aren't going to perform any cx exams in the meantime unless necessary  Sonya West [facility] 09/22/2023 8:44 PM

## 2023-09-22 NOTE — MAU Note (Signed)
.  Sonya West is a 33 y.o. at [redacted]w[redacted]d here in MAU reporting: leaking fluid since 10:30 am, reports occasional uc. Reports positive fetal movement.   Onset of complaint: 1030  There were no vitals filed for this visit.    Lab orders placed from triage: none

## 2023-09-23 ENCOUNTER — Encounter (HOSPITAL_COMMUNITY): Payer: Self-pay | Admitting: Family Medicine

## 2023-09-23 ENCOUNTER — Inpatient Hospital Stay (HOSPITAL_COMMUNITY): Payer: No Typology Code available for payment source | Admitting: Anesthesiology

## 2023-09-23 ENCOUNTER — Other Ambulatory Visit: Payer: Self-pay

## 2023-09-23 DIAGNOSIS — O4202 Full-term premature rupture of membranes, onset of labor within 24 hours of rupture: Secondary | ICD-10-CM

## 2023-09-23 DIAGNOSIS — O480 Post-term pregnancy: Secondary | ICD-10-CM

## 2023-09-23 DIAGNOSIS — Z3A40 40 weeks gestation of pregnancy: Secondary | ICD-10-CM

## 2023-09-23 MED ORDER — SODIUM CHLORIDE 0.9 % IV SOLN: 250.0000 mL | INTRAVENOUS | Status: DC | PRN

## 2023-09-23 MED ORDER — MEASLES, MUMPS & RUBELLA VAC IJ SOLR
0.5000 mL | Freq: Once | INTRAMUSCULAR | Status: AC
  Administered 2023-09-24: 0.5 mL via SUBCUTANEOUS
  Filled 2023-09-23: qty 0.5

## 2023-09-23 MED ORDER — FENTANYL-BUPIVACAINE-NACL 0.5-0.125-0.9 MG/250ML-% EP SOLN: 12.0000 mL/h | EPIDURAL | Status: DC | PRN

## 2023-09-23 MED ORDER — OXYCODONE HCL 5 MG PO TABS: 5.0000 mg | ORAL_TABLET | ORAL | Status: DC | PRN

## 2023-09-23 MED ORDER — SENNOSIDES-DOCUSATE SODIUM 8.6-50 MG PO TABS
2.0000 | ORAL_TABLET | ORAL | Status: DC
  Administered 2023-09-24: 2 via ORAL
  Filled 2023-09-23: qty 2

## 2023-09-23 MED ORDER — FENTANYL CITRATE (PF) 100 MCG/2ML IJ SOLN
INTRAMUSCULAR | Status: DC | PRN
  Administered 2023-09-23: 100 ug via EPIDURAL

## 2023-09-23 MED ORDER — OXYCODONE HCL 5 MG PO TABS: 10.0000 mg | ORAL_TABLET | ORAL | Status: DC | PRN

## 2023-09-23 MED ORDER — FENTANYL CITRATE (PF) 100 MCG/2ML IJ SOLN
INTRAMUSCULAR | Status: AC
  Filled 2023-09-23: qty 2

## 2023-09-23 MED ORDER — IBUPROFEN 600 MG PO TABS
600.0000 mg | ORAL_TABLET | Freq: Four times a day (QID) | ORAL | Status: DC
  Administered 2023-09-24 – 2023-09-25 (×5): 600 mg via ORAL
  Filled 2023-09-23 (×5): qty 1

## 2023-09-23 MED ORDER — TETANUS-DIPHTH-ACELL PERTUSSIS 5-2.5-18.5 LF-MCG/0.5 IM SUSY: 0.5000 mL | PREFILLED_SYRINGE | Freq: Once | INTRAMUSCULAR | Status: DC

## 2023-09-23 MED ORDER — DIPHENHYDRAMINE HCL 25 MG PO CAPS: 25.0000 mg | ORAL_CAPSULE | Freq: Four times a day (QID) | ORAL | Status: DC | PRN

## 2023-09-23 MED ORDER — ACETAMINOPHEN 325 MG PO TABS: 650.0000 mg | ORAL_TABLET | ORAL | Status: DC | PRN

## 2023-09-23 MED ORDER — FENTANYL CITRATE (PF) 100 MCG/2ML IJ SOLN: 100.0000 ug | Freq: Once | INTRAMUSCULAR | Status: DC

## 2023-09-23 MED ORDER — TRANEXAMIC ACID-NACL 1000-0.7 MG/100ML-% IV SOLN
INTRAVENOUS | Status: AC
Start: 2023-09-23 — End: 2023-09-24
  Filled 2023-09-23: qty 100

## 2023-09-23 MED ORDER — PHENYLEPHRINE 80 MCG/ML (10ML) SYRINGE FOR IV PUSH (FOR BLOOD PRESSURE SUPPORT): 80.0000 ug | PREFILLED_SYRINGE | INTRAVENOUS | Status: DC | PRN

## 2023-09-23 MED ORDER — BENZOCAINE-MENTHOL 20-0.5 % EX AERO: 1.0000 | INHALATION_SPRAY | CUTANEOUS | Status: DC | PRN

## 2023-09-23 MED ORDER — OXYTOCIN-SODIUM CHLORIDE 30-0.9 UT/500ML-% IV SOLN
1.0000 m[IU]/min | INTRAVENOUS | Status: DC
  Administered 2023-09-23: 2 m[IU]/min via INTRAVENOUS
  Filled 2023-09-23: qty 500

## 2023-09-23 MED ORDER — DIPHENHYDRAMINE HCL 50 MG/ML IJ SOLN: 12.5000 mg | INTRAMUSCULAR | Status: DC | PRN

## 2023-09-23 MED ORDER — ONDANSETRON HCL 4 MG/2ML IJ SOLN: 4.0000 mg | INTRAMUSCULAR | Status: DC | PRN

## 2023-09-23 MED ORDER — SIMETHICONE 80 MG PO CHEW: 80.0000 mg | CHEWABLE_TABLET | ORAL | Status: DC | PRN

## 2023-09-23 MED ORDER — EPHEDRINE 5 MG/ML INJ: 10.0000 mg | INTRAVENOUS | Status: DC | PRN

## 2023-09-23 MED ORDER — TRANEXAMIC ACID-NACL 1000-0.7 MG/100ML-% IV SOLN
1000.0000 mg | Freq: Once | INTRAVENOUS | Status: AC | PRN
  Administered 2023-09-23: 1000 mg via INTRAVENOUS

## 2023-09-23 MED ORDER — SODIUM CHLORIDE 0.9% FLUSH
3.0000 mL | Freq: Two times a day (BID) | INTRAVENOUS | Status: DC
  Administered 2023-09-24: 3 mL via INTRAVENOUS

## 2023-09-23 MED ORDER — LIDOCAINE HCL (PF) 1 % IJ SOLN
INTRAMUSCULAR | Status: DC | PRN
  Administered 2023-09-23: 8 mL via EPIDURAL

## 2023-09-23 MED ORDER — DIBUCAINE (PERIANAL) 1 % EX OINT: 1.0000 | TOPICAL_OINTMENT | CUTANEOUS | Status: DC | PRN

## 2023-09-23 MED ORDER — SODIUM CHLORIDE 0.9% FLUSH: 3.0000 mL | INTRAVENOUS | Status: DC | PRN

## 2023-09-23 MED ORDER — WITCH HAZEL-GLYCERIN EX PADS: 1.0000 | MEDICATED_PAD | CUTANEOUS | Status: DC | PRN

## 2023-09-23 MED ORDER — PRENATAL MULTIVITAMIN CH
1.0000 | ORAL_TABLET | Freq: Every day | ORAL | Status: DC
  Administered 2023-09-24: 1 via ORAL
  Filled 2023-09-23: qty 1

## 2023-09-23 MED ORDER — ZOLPIDEM TARTRATE 5 MG PO TABS: 5.0000 mg | ORAL_TABLET | Freq: Every evening | ORAL | Status: DC | PRN

## 2023-09-23 MED ORDER — LACTATED RINGERS IV SOLN: 500.0000 mL | Freq: Once | INTRAVENOUS | Status: DC

## 2023-09-23 MED ORDER — TERBUTALINE SULFATE 1 MG/ML IJ SOLN: 0.2500 mg | Freq: Once | INTRAMUSCULAR | Status: DC | PRN

## 2023-09-23 MED ORDER — COCONUT OIL OIL: 1.0000 | TOPICAL_OIL | Status: DC | PRN

## 2023-09-23 MED ORDER — FENTANYL CITRATE (PF) 100 MCG/2ML IJ SOLN
100.0000 ug | INTRAMUSCULAR | Status: DC | PRN
  Administered 2023-09-23 (×2): 100 ug via INTRAVENOUS
  Filled 2023-09-23 (×2): qty 2

## 2023-09-23 MED ORDER — FENTANYL-BUPIVACAINE-NACL 0.5-0.125-0.9 MG/250ML-% EP SOLN
12.0000 mL/h | EPIDURAL | Status: DC | PRN
  Administered 2023-09-23: 12 mL/h via EPIDURAL
  Filled 2023-09-23: qty 250

## 2023-09-23 MED ORDER — LACTATED RINGERS IV SOLN
500.0000 mL | Freq: Once | INTRAVENOUS | Status: AC
  Administered 2023-09-23: 500 mL via INTRAVENOUS

## 2023-09-23 MED ORDER — SODIUM BICARBONATE 8.4 % IV SOLN
INTRAVENOUS | Status: DC | PRN
  Administered 2023-09-23: 6 mL via EPIDURAL

## 2023-09-23 MED ORDER — ONDANSETRON HCL 4 MG PO TABS: 4.0000 mg | ORAL_TABLET | ORAL | Status: DC | PRN

## 2023-09-23 NOTE — Discharge Summary (Signed)
 Postpartum Discharge Summary    Patient Name: Tabatha Razzano DOB: [birth year] MRN: [id number]  Date of admission: 09/22/2023 Delivery date:09/23/2023 Delivering provider: Drema Balzarine Date of discharge: 09/25/2023  Admitting diagnosis: Post-dates pregnancy [O48.0] Intrauterine pregnancy: [redacted]w[redacted]d     Secondary diagnosis:  Active Problems:   Asthma   Rubella non-immune status, antepartum   Rh negative state in antepartum period  Additional problems: None    Discharge diagnosis: Term Pregnancy Delivered                                              Post partum procedures:rhogam and MMR Augmentation: Pitocin Complications: None  Hospital course: Onset of Labor With Vaginal Delivery      33 y.o. yo G1P1001 at [redacted]w[redacted]d was admitted in Latent Labor on 09/22/2023. Labor course was complicated by none.  Membrane Rupture Time/Date: 9:30 AM,09/22/2023  Delivery Method:Vaginal, Spontaneous Operative Delivery:N/A Episiotomy: None Lacerations:  Vaginal;Labial Patient had a postpartum course complicated by nothing, some issues with breastfeeding and worked with [facility] and plans to see outpatient.  She is ambulating, tolerating a regular diet, passing flatus, and urinating well. Patient is discharged home in stable condition on 09/25/23.  Newborn Data: Birth date:09/23/2023 Birth time:5:48 PM Gender:Female Living status:Living Apgars:9 ,9  Weight:3190 g  Magnesium Sulfate received: No BMZ received: No Rhophylac:N/A MMR:Yes - confirmed it was given 2/28  T-DaP:Given prenatally Flu: No RSV Vaccine received: No Transfusion:No  Immunizations received: Immunization History  Administered Date(s) Administered   MMR 09/24/2023   Tdap 04/22/2021, 07/06/2023    Physical exam  Vitals:   09/24/23 0819 09/24/23 1230 09/24/23 2010 09/25/23 0535  BP: 113/83 108/82 119/88 114/81  Pulse: 89 60 60 67  Resp: 20 15 18 18   Temp: 98.1 F (36.7 C) 98.2 F (36.8 C) 98 F (36.7 C) 97.7 F (36.5 C)   TempSrc: Oral Oral Oral Oral  SpO2: 96% 99% 99% 99%  Weight:      Height:       General: alert, cooperative, and no distress Lochia: appropriate Uterine Fundus: firm Incision: N/A DVT Evaluation: No evidence of DVT seen on physical exam. Labs: Lab Results  Component Value Date   WBC 13.3 (H) 09/24/2023   HGB 11.9 (L) 09/24/2023   HCT 33.7 (L) 09/24/2023   MCV 87.8 09/24/2023   PLT 173 09/24/2023      Latest Ref Rng & Units 10/13/2022    8:33 AM  CMP  Glucose 65 - 99 mg/dL 75   BUN 7 - 25 mg/dL 14   Creatinine 7.25 - 0.97 mg/dL 3.66   Sodium 440 - 347 mmol/L 140   Potassium 3.5 - 5.3 mmol/L 3.6   Chloride 98 - 110 mmol/L 103   CO2 20 - 32 mmol/L 28   Calcium 8.6 - 10.2 mg/dL 9.5   Total Protein 6.1 - 8.1 g/dL 7.3   Total Bilirubin 0.2 - 1.2 mg/dL 0.5   AST 10 - 30 U/L 18   ALT 6 - 29 U/L 11    Edinburgh Score:    09/24/2023    5:57 PM  Edinburgh Postnatal Depression Scale Screening Tool  I have been able to laugh and see the funny side of things. 0  I have looked forward with enjoyment to things. 0  I have blamed myself unnecessarily when things went wrong. 1  I have  been anxious or worried for no good reason. 1  I have felt scared or panicky for no good reason. 0  Things have been getting on top of me. 2  I have been so unhappy that I have had difficulty sleeping. 0  I have felt sad or miserable. 0  I have been so unhappy that I have been crying. 1  The thought of harming myself has occurred to me. 0  Edinburgh Postnatal Depression Scale Total 5   Edinburgh Postnatal Depression Scale Total: 5   After visit meds:  Allergies as of 09/25/2023       Reactions   Azithromycin Other (See Comments)   Blisters in mouth, ? SJS   Gentamicin Other (See Comments)   Blisters in mouth at present.        Medication List     TAKE these medications    acetaminophen 325 MG tablet Commonly known as: Tylenol Take 2 tablets (650 mg total) by mouth every 4 (four)  hours as needed (for pain scale < 4).   albuterol 108 (90 Base) MCG/ACT inhaler Commonly known as: VENTOLIN HFA Inhale 2 puffs into the lungs every 6 (six) hours as needed. 30 minutes before activity   coconut oil Oil Apply 1 Application topically as needed.   dibucaine 1 % Oint Commonly known as: NUPERCAINAL Place 1 Application rectally as needed for hemorrhoids.   ibuprofen 600 MG tablet Commonly known as: ADVIL Take 1 tablet (600 mg total) by mouth every 6 (six) hours.   multivitamin-prenatal 27-0.8 MG Tabs tablet Take 1 tablet by mouth daily at 12 noon.   senna-docusate 8.6-50 MG tablet Commonly known as: Senokot-S Take 2 tablets by mouth daily.   sertraline 50 MG tablet Commonly known as: ZOLOFT TAKE 1 TABLET BY MOUTH EVERYDAY AT BEDTIME   witch hazel-glycerin pad Commonly known as: TUCKS Apply 1 Application topically as needed for hemorrhoids.         Discharge home in stable condition Infant Feeding: Breast Infant Disposition:home with mother Discharge instruction: per After Visit Summary and Postpartum booklet. Activity: Advance as tolerated. Pelvic rest for 6 weeks.  Diet: routine diet Future Appointments: Future Appointments  Date Time Provider Department Center  10/14/2023  8:30 AM Christen Butter, NP [facility] None  11/02/2023  3:10 PM Rasch, Harolyn Rutherford, NP [facility]   Follow up Visit:  Message sent to [facility] 2/27  Please schedule this patient for a In person postpartum visit in 4 weeks with the following provider: Any provider. Additional Postpartum F/U: None   Low risk pregnancy complicated by:  none Delivery mode:  Vaginal, Spontaneous Anticipated Birth Control:  Condoms   09/25/2023 Federico Flake, MD

## 2023-09-23 NOTE — Progress Notes (Signed)
 LABOR PROGRESS NOTE  Patient Name: Sonya West, female   DOB: 05/22/1991, 33 y.o.  MRN: [id number]  Patient s/p laboring down and position changes. +1 to +2. Start pushing. Variable decels with pushing, otherwise quick return to baseline, accels, and moderate variability.  Joanne Gavel, MD

## 2023-09-23 NOTE — Lactation Note (Signed)
 This note was copied from a baby's chart. Lactation Consultation Note  Patient Name: Sonya West [id number] Date: 09/23/2023 Age:32 hours Reason for consult: Initial assessment;Primapara;1st time breastfeeding;Term  P1- MOB reports that feedings have gone well so far. As MOB was talking to [facility], she fell asleep mid sentence. LC confirmed that MOB was okay and only sleeping. FOB continued the conversation with [facility] confirming that infant has nursed well so far with no concerns. LC reviewed feeding infant on cue 8-12x in 24 hrs and not allowing infant to go over 3 hrs without a feeding. FOB set an alarm on his phone for the next feeding. LC encouraged FOB to call [facility] team if MOB needs further support.  Maternal Data Has patient been taught Hand Expression?: No Does the patient have breastfeeding experience prior to this delivery?: No  Feeding Mother's Current Feeding Choice: Breast Milk  Lactation Tools Discussed/Used Pump Education: Milk Storage  Interventions Interventions: Breast feeding basics reviewed;Education;LC Services brochure  Discharge Discharge Education: Warning signs for feeding baby Pump: DEBP;Hands Free;Personal  Consult Status Consult Status: Follow-up Date: 09/24/23 Follow-up type: In-patient    Dema Severin BS, IBCLC 09/23/2023, 10:23 PM

## 2023-09-23 NOTE — Progress Notes (Signed)
 Patient ID: Sonya West, female   DOB: Mar 18, 1991, 33 y.o.   MRN: [id number]  Has rested some overnight, but also has reported pain 8/10 at times over night and has rec'd Fentanyl  T 98.7, other VSS FHR 115-120, +accels, no decels Irreg ctx Cx 2-3/80/vtx -2  IUP@40 .0wks PROM x 21hrs  Open now to starting Pitocin since exam is essentially the same; will begin 2x2 and uptitrate to get reg ctx No s/s of chorio; will continue to monitor and to limit exams Anticipate vag delivery  Sonya West [facility] 09/23/2023 6:52 AM

## 2023-09-23 NOTE — Progress Notes (Signed)
 LABOR PROGRESS NOTE  Patient Name: Sonya West, female   DOB: July 07, 1991, 33 y.o.  MRN: [id number]  Patient very comfortable s/p epidural. Cervix 10/100/0. Will trial position changes to promote descent, then start pushing. Cat I tracing. Continue pit.  Joanne Gavel, MD

## 2023-09-23 NOTE — Anesthesia Preprocedure Evaluation (Signed)
 Anesthesia Evaluation  Patient identified by MRN, date of birth, ID band Patient awake    Reviewed: Allergy & Precautions, H&P , NPO status , Patient's Chart, lab work & pertinent test results, reviewed documented beta blocker date and time   Airway Mallampati: I  TM Distance: >3 FB Neck ROM: full    Dental no notable dental hx. (+) Teeth Intact, Dental Advisory Given   Pulmonary neg pulmonary ROS, former smoker   Pulmonary exam normal breath sounds clear to auscultation       Cardiovascular negative cardio ROS Normal cardiovascular exam Rhythm:regular Rate:Normal     Neuro/Psych negative neurological ROS  negative psych ROS   GI/Hepatic negative GI ROS, Neg liver ROS,,,  Endo/Other  negative endocrine ROS    Renal/GU negative Renal ROS  negative genitourinary   Musculoskeletal negative musculoskeletal ROS (+)    Abdominal   Peds negative pediatric ROS (+)  Hematology negative hematology ROS (+)   Anesthesia Other Findings   Reproductive/Obstetrics negative OB ROS (+) Pregnancy                             Anesthesia Physical Anesthesia Plan  ASA: 2  Anesthesia Plan: Epidural   Post-op Pain Management: Minimal or no pain anticipated   Induction: Intravenous  PONV Risk Score and Plan: 2  Airway Management Planned: Natural Airway and Simple Face Mask  Additional Equipment: None  Intra-op Plan:   Post-operative Plan:   Informed Consent: I have reviewed the patients History and Physical, chart, labs and discussed the procedure including the risks, benefits and alternatives for the proposed anesthesia with the patient or authorized representative who has indicated his/her understanding and acceptance.     Dental Advisory Given  Plan Discussed with: Anesthesiologist  Anesthesia Plan Comments: (Labs checked- platelets confirmed with RN in room. Fetal heart tracing, per RN,  reported to be stable enough for sitting procedure. Discussed epidural, and patient consents to the procedure:  included risk of possible headache,backache, failed block, allergic reaction, and nerve injury. This patient was asked if she had any questions or concerns before the procedure started.)       Anesthesia Quick Evaluation

## 2023-09-23 NOTE — Progress Notes (Signed)
 Labor Progress Note Sonya West is a 32 y.o. G1P0000 at [redacted]w[redacted]d presented for PROM. S: Patient doing ok with support persons at bedside. Having significant pain and would like IV pain medication.  O:  BP (!) 102/58   Pulse 99   Temp 98.2 F (36.8 C) (Axillary)   Resp 17   Ht 5\' 2"  (1.575 m)   Wt 78.3 kg   LMP 12/21/2022   SpO2 99%   BMI 31.59 kg/m  EFM: 125/mdoerate variability/+accels/occasional variables  CVE: Dilation: 5 Effacement (%): 80 Station: -1 Presentation: Vertex Exam by:: [name], MD   A&P: 33 y.o. G1P0000 [redacted]w[redacted]d here for PROM.  #Labor: Progressing well. On pit x ~2 hours. Will continue. #Pain: IV pain med. Epidural if patient requests. #FWB: Category II w/ occasional variables and minimal variability earlier this morning, however improving and reassuring at this time. #GBS negative  [name], MD Family Medicine Resident, PGY-1 9:50 AM

## 2023-09-23 NOTE — Anesthesia Procedure Notes (Signed)
 Epidural Patient location during procedure: OB Start time: 09/23/2023 10:34 AM End time: 09/23/2023 10:40 AM  Staffing Anesthesiologist: Bethena Midget, MD  Preanesthetic Checklist Completed: patient identified, IV checked, site marked, risks and benefits discussed, surgical consent, monitors and equipment checked, pre-op evaluation and timeout performed  Epidural Patient position: sitting Prep: DuraPrep and site prepped and draped Patient monitoring: continuous pulse ox and blood pressure Approach: midline Location: L3-L4 Injection technique: LOR air  Needle:  Needle type: Tuohy  Needle gauge: 17 G Needle length: 9 cm and 9 Needle insertion depth: 6 cm Catheter type: closed end flexible Catheter size: 19 Gauge Catheter at skin depth: 12 cm Test dose: negative  Assessment Events: blood not aspirated, no cerebrospinal fluid, injection not painful, no injection resistance, no paresthesia and negative IV test

## 2023-09-24 LAB — CBC
HCT: 33.7 % — ABNORMAL LOW (ref 36.0–46.0)
Hemoglobin: 11.9 g/dL — ABNORMAL LOW (ref 12.0–15.0)
MCH: 31 pg (ref 26.0–34.0)
MCHC: 35.3 g/dL (ref 30.0–36.0)
MCV: 87.8 fL (ref 80.0–100.0)
Platelets: 173 10*3/uL (ref 150–400)
RBC: 3.84 MIL/uL — ABNORMAL LOW (ref 3.87–5.11)
RDW: 14.6 % (ref 11.5–15.5)
WBC: 13.3 10*3/uL — ABNORMAL HIGH (ref 4.0–10.5)
nRBC: 0 % (ref 0.0–0.2)

## 2023-09-24 MED ORDER — RHO D IMMUNE GLOBULIN 1500 UNIT/2ML IJ SOSY
300.0000 ug | PREFILLED_SYRINGE | Freq: Once | INTRAMUSCULAR | Status: AC
  Administered 2023-09-24: 300 ug via INTRAVENOUS
  Filled 2023-09-24: qty 2

## 2023-09-24 NOTE — Progress Notes (Signed)
 MOB was referred for history of depression.  * Referral screened out by Clinical Social Worker because none of the following criteria appear to apply:  ~ History of depression during this pregnancy, or of post-partum depression following prior delivery.  ~ Diagnosis of depression within last 3 years  OR  * MOB's symptoms currently being treated with medication and/or therapy.  Per OB notes, MOB is prescribed Zoloft for support.  Please contact the Clinical Social Worker if needs arise, by [facility] request, or if MOB scores greater than 9/yes to question 10 on Edinburgh Postpartum Depression Screen.  Enos Fling, Theresia Majors Clinical Social Worker [phone]

## 2023-09-24 NOTE — Anesthesia Postprocedure Evaluation (Signed)
 Anesthesia Post Note  Patient: Alohilani Darko  Procedure(s) Performed: AN AD HOC LABOR EPIDURAL     Patient location during evaluation: Mother Baby Anesthesia Type: Epidural Level of consciousness: awake and alert and oriented Pain management: satisfactory to patient Vital Signs Assessment: post-procedure vital signs reviewed and stable Respiratory status: respiratory function stable Cardiovascular status: stable Postop Assessment: no headache, no backache, epidural receding, patient able to bend at knees, no signs of nausea or vomiting, adequate PO intake and able to ambulate Anesthetic complications: no   No notable events documented.  Last Vitals:  Vitals:   09/24/23 0002 09/24/23 0419  BP: 122/64 118/82  Pulse: 83 89  Resp: 18 18  Temp: 36.8 C 37.1 C  SpO2: 99% 99%    Last Pain:  Vitals:   09/24/23 0800  TempSrc:   PainSc: 2    Pain Goal:                Epidural/Spinal Function Cutaneous sensation: Normal sensation (09/24/23 0800), Patient able to flex knees: Yes (09/24/23 0800), Patient able to lift hips off bed: Yes (09/24/23 0800), Back pain beyond tenderness at insertion site: No (09/24/23 0800), Progressively worsening motor and/or sensory loss: No (09/24/23 0800), Bowel and/or bladder incontinence post epidural: No (09/24/23 0800)  Karleen Dolphin

## 2023-09-24 NOTE — Progress Notes (Signed)
 POSTPARTUM PROGRESS NOTE  Subjective: Sonya West is a 33 y.o. G1P1001 s/p SVD at [redacted]w[redacted]d.  She reports she doing well. No acute events overnight. She denies any problems with ambulating, voiding or po intake. Denies nausea or vomiting. She has passed flatus. Pain is well controlled.  Lochia is moderate.  Objective: Blood pressure 108/82, pulse 60, temperature 98.2 F (36.8 C), temperature source Oral, resp. rate 15, height 5\' 2"  (1.575 m), weight 78.3 kg, last menstrual period 12/21/2022, SpO2 99%, unknown if currently breastfeeding.  Physical Exam:  General: alert, cooperative and no distress Chest: no respiratory distress Abdomen: soft, non-tender  Uterine Fundus: firm and at level of umbilicus Extremities: No calf swelling or tenderness  No edema  Recent Labs    09/22/23 1122 09/24/23 0437  HGB 13.1 11.9*  HCT 37.2 33.7*    Assessment/Plan: Sonya West is a 33 y.o. G1P1001 s/p SVD at [redacted]w[redacted]d for SROM.  Routine Postpartum Care: Doing well, pain well-controlled.  -- Continue routine care, lactation support  -- Contraception: Condoms -- Feeding: BF  Dispo: Plan for discharge tomorrow.  Fortunato Curling, DO Faculty Practice, [facility] 09/24/2023 12:33 PM

## 2023-09-25 LAB — RH IG WORKUP (INCLUDES ABO/RH)
Fetal Screen: NEGATIVE
Gestational Age(Wks): 40
Unit division: 0

## 2023-09-25 MED ORDER — ACETAMINOPHEN 325 MG PO TABS: 650.0000 mg | ORAL_TABLET | ORAL | Status: AC | PRN

## 2023-09-25 MED ORDER — IBUPROFEN 600 MG PO TABS: 600.0000 mg | ORAL_TABLET | Freq: Four times a day (QID) | ORAL | 1 refills | Status: DC

## 2023-09-25 MED ORDER — DIBUCAINE (PERIANAL) 1 % EX OINT: 1.0000 | TOPICAL_OINTMENT | CUTANEOUS | Status: DC | PRN

## 2023-09-25 MED ORDER — COCONUT OIL OIL: 1.0000 | TOPICAL_OIL | Status: DC | PRN

## 2023-09-25 MED ORDER — SENNOSIDES-DOCUSATE SODIUM 8.6-50 MG PO TABS
2.0000 | ORAL_TABLET | ORAL | Status: DC
Start: 2023-09-25 — End: 2024-03-20

## 2023-09-25 MED ORDER — WITCH HAZEL-GLYCERIN EX PADS: 1.0000 | MEDICATED_PAD | CUTANEOUS | 12 refills | Status: DC | PRN

## 2023-09-25 NOTE — Lactation Note (Addendum)
 This note was copied from a baby's chart. Lactation Consultation Note  Patient Name: Sonya West ZOXWR'U Date: 09/25/2023 Age:32 hours Reason for consult: Follow-up assessment;1st time breastfeeding;Term  P1, Baby came off breast when [facility] entered room.  Nipple rounded intact, mother denies soreness.  Bruise on mother's left areola.  When LC did oral assessment noted baby has short anterior lingual frenulum easily visible when baby cries.  Provided tongue restriction resource sheet.  Suggest discussing with Ped MD.  Mother states her sister had tongue tie.   With permission assisted mother with latching baby in football hold.  Baby sustained latch for an additional 10 min. Suggest post pumping 3-4 times per day for 15 min to stimulate mother's milk supply and give volume pumped back to baby until tongue is evaluated. Mother is possibly discharged today so provided her with hand pump but suggest DEBP if she ends up staying or at home. Suggest post pumping every other feeding.  Reviewed engorgement care, milk storage and monitoring voids/stools. Recommend follow up appointment. Message sent.   Maternal Data Has patient been taught Hand Expression?: Yes Does the patient have breastfeeding experience prior to this delivery?: No  Feeding Mother's Current Feeding Choice: Breast Milk  LATCH Score Latch: Grasps breast easily, tongue down, lips flanged, rhythmical sucking.  Audible Swallowing: A few with stimulation  Type of Nipple: Everted at rest and after stimulation  Comfort (Breast/Nipple): Soft / non-tender  Hold (Positioning): Assistance needed to correctly position infant at breast and maintain latch.  LATCH Score: 8   Lactation Tools Discussed/Used  Manual pump, 21 mm flange  Interventions Interventions: Breast feeding basics reviewed;Assisted with latch;Skin to skin;Hand express;Education  Discharge Discharge Education: Engorgement and breast care;Warning signs for  feeding baby;Outpatient recommendation Pump: Manual;DEBP;Personal  Consult Status Consult Status: Complete Date: 09/25/23   Hardie Pulley  RN, IBCLC 09/25/2023, 10:19 AM

## 2023-09-25 NOTE — Lactation Note (Signed)
 This note was copied from a baby's chart. Lactation Consultation Note  Patient Name: Sonya West ZOXWR'U Date: 09/25/2023 Age:32 hours Reason for consult: Follow-up assessment;1st time breastfeeding;Term (Infant with weight loss -2.67%) C/O short feedings 5 minutes LC entered room, MOB attempting latch infant without pillow support and infant swaddled in clothing and blankets. LC discussed importance of skin to skin infant's first week of life and using pillow support when latching infant at the breast. MOB was open to [facility] suggestion. MOB re-latched infant on her right breast using the cross cradle hold with pillow support, infant sustained his latch and BF for 10 minutes. Afterwards infant was burped, MOB hand expressed 2 mls of colostrum that was spoon fed to infant and then infant re-latched on MOB left breast using the football hold position infant sustained his latch and was still BF after 15 minutes when LC left the room.   Current feeding plan: 1- MOB will continue to BF infant by cues, on demand, every 2-3 hours, skin to skin. 2- MOB knows if infant is still cuing after latching on the 1st breast to offer 2nd breast during the same feeding, MOB working on infant feeding for longer duration at the breast greater than 5 minutes on day 2 of life. 3- MOB knows to call for further latch assistance if needed.  Maternal Data    Feeding Mother's Current Feeding Choice: Breast Milk  LATCH Score Latch: Grasps breast easily, tongue down, lips flanged, rhythmical sucking.  Audible Swallowing: A few with stimulation  Type of Nipple: Everted at rest and after stimulation  Comfort (Breast/Nipple): Soft / non-tender  Hold (Positioning): Assistance needed to correctly position infant at breast and maintain latch.  LATCH Score: 8   Lactation Tools Discussed/Used    Interventions Interventions: Skin to skin;Assisted with latch;Breast compression;Adjust position;Support pillows;Hand  express;Position options;Expressed milk;Education  Discharge    Consult Status Consult Status: Follow-up Date: 09/25/23 Follow-up type: In-patient    Frederico Hamman 09/25/2023, 2:24 AM

## 2023-09-26 LAB — BIRTH TISSUE RECOVERY COLLECTION (PLACENTA DONATION)

## 2023-09-28 ENCOUNTER — Encounter: Payer: No Typology Code available for payment source | Admitting: Obstetrics and Gynecology

## 2023-09-29 ENCOUNTER — Encounter: Payer: Self-pay | Admitting: *Deleted

## 2023-09-30 ENCOUNTER — Inpatient Hospital Stay (HOSPITAL_COMMUNITY): Payer: No Typology Code available for payment source

## 2023-09-30 ENCOUNTER — Inpatient Hospital Stay (HOSPITAL_COMMUNITY)
Admission: RE | Admit: 2023-09-30 | Payer: No Typology Code available for payment source | Source: Home / Self Care | Admitting: Obstetrics and Gynecology

## 2023-10-01 ENCOUNTER — Telehealth (HOSPITAL_COMMUNITY): Payer: Self-pay | Admitting: *Deleted

## 2023-10-01 NOTE — Telephone Encounter (Signed)
 10/01/2023  Name: Sonya West MRN: [id number] DOB: 10-27-1990  Reason for Call:  Transition of Care Hospital Discharge Call  Contact Status: Patient Contact Status: Message  Language assistant needed:          Follow-Up Questions:    Inocente Salles Postnatal Depression Scale:  In the Past 7 Days:    PHQ2-9 Depression Scale:     Discharge Follow-up:    Post-discharge interventions: NA  Salena Saner, RN 10/01/2023 14:20

## 2023-10-14 ENCOUNTER — Encounter: Payer: Self-pay | Admitting: Medical-Surgical

## 2023-10-14 ENCOUNTER — Ambulatory Visit (INDEPENDENT_AMBULATORY_CARE_PROVIDER_SITE_OTHER): Payer: No Typology Code available for payment source | Admitting: Medical-Surgical

## 2023-10-14 VITALS — BP 118/81 | HR 88 | Resp 20 | Ht 62.0 in | Wt 155.0 lb

## 2023-10-14 DIAGNOSIS — Z0000 Encounter for general adult medical examination without abnormal findings: Secondary | ICD-10-CM

## 2023-10-14 DIAGNOSIS — F322 Major depressive disorder, single episode, severe without psychotic features: Secondary | ICD-10-CM | POA: Diagnosis not present

## 2023-10-14 NOTE — Progress Notes (Signed)
 Complete physical exam  Patient: Sonya West   DOB: March 03, 1991   32 y.o. Female  MRN: [id number]  Subjective:    Chief Complaint  Patient presents with   Annual Exam    Sonya West is a 33 y.o. female who presents today for a complete physical exam. She reports consuming a general diet. The patient does not participate in regular exercise at present. She generally feels fairly well. She reports sleeping fairly well. She does not have additional problems to discuss today.    Most recent fall risk assessment:    02/02/2023   12:03 PM  Fall Risk   Falls in the past year? 0  Number falls in past yr: 0  Injury with Fall? 0  Risk for fall due to : No Fall Risks  Follow up Falls evaluation completed     Most recent depression screenings:    10/14/2023    8:59 AM 07/06/2023    9:28 AM  PHQ 2/9 Scores  PHQ - 2 Score 1 0  PHQ- 9 Score 6 2    Vision:Not within last year , Dental: No current dental problems and No regular dental care , and STD: The patient denies history of sexually transmitted disease.    Patient Care Team: Christen Butter, NP as PCP - General (Nurse Practitioner) Lennart Pall, MD as PCP - OBGYN (Obstetrics and Gynecology) Lesly Dukes, MD as Consulting Physician (Obstetrics and Gynecology)   Outpatient Medications Prior to Visit  Medication Sig   acetaminophen (TYLENOL) 325 MG tablet Take 2 tablets (650 mg total) by mouth every 4 (four) hours as needed (for pain scale < 4).   albuterol (VENTOLIN HFA) 108 (90 Base) MCG/ACT inhaler Inhale 2 puffs into the lungs every 6 (six) hours as needed. 30 minutes before activity   coconut oil OIL Apply 1 Application topically as needed.   dibucaine (NUPERCAINAL) 1 % OINT Place 1 Application rectally as needed for hemorrhoids.   ibuprofen (ADVIL) 600 MG tablet Take 1 tablet (600 mg total) by mouth every 6 (six) hours.   Prenatal Vit-Fe Fumarate-FA (MULTIVITAMIN-PRENATAL) 27-0.8 MG TABS tablet Take 1 tablet by mouth  daily at 12 noon.   senna-docusate (SENOKOT-S) 8.6-50 MG tablet Take 2 tablets by mouth daily.   sertraline (ZOLOFT) 50 MG tablet TAKE 1 TABLET BY MOUTH EVERYDAY AT BEDTIME   witch hazel-glycerin (TUCKS) pad Apply 1 Application topically as needed for hemorrhoids.   No facility-administered medications prior to visit.    Review of Systems  Constitutional:  Positive for malaise/fatigue.  Respiratory:  Positive for cough, sputum production and wheezing.   Cardiovascular:  Negative for chest pain, palpitations and leg swelling.  Gastrointestinal: Negative.   Genitourinary: Negative.   Neurological:  Negative for dizziness, tingling, seizures, weakness and headaches.  Endo/Heme/Allergies:  Negative for environmental allergies.  Psychiatric/Behavioral:  Positive for substance abuse. Negative for depression and suicidal ideas. The patient is not nervous/anxious and does not have insomnia.           Objective:     BP 118/81 (BP Location: Left Arm, Cuff Size: Normal)   Pulse 88   Resp 20   Ht 5\' 2"  (1.575 m)   Wt 155 lb (70.3 kg)   LMP 12/21/2022   SpO2 97%   Breastfeeding Yes   BMI 28.35 kg/m    Physical Exam Vitals reviewed.  Constitutional:      General: She is not in acute distress.    Appearance: Normal appearance. She is  not ill-appearing.  HENT:     Head: Normocephalic and atraumatic.     Right Ear: Tympanic membrane, ear canal and external ear normal. There is no impacted cerumen.     Left Ear: Tympanic membrane, ear canal and external ear normal. There is no impacted cerumen.     Nose: Nose normal. No congestion or rhinorrhea.     Mouth/Throat:     Mouth: Mucous membranes are moist.     Pharynx: No oropharyngeal exudate or posterior oropharyngeal erythema.  Eyes:     General: No scleral icterus.       Right eye: No discharge.        Left eye: No discharge.     Extraocular Movements: Extraocular movements intact.     Conjunctiva/sclera: Conjunctivae normal.      Pupils: Pupils are equal, round, and reactive to light.  Neck:     Thyroid: No thyromegaly.     Vascular: No carotid bruit or JVD.     Trachea: Trachea normal.  Cardiovascular:     Rate and Rhythm: Normal rate and regular rhythm.     Pulses: Normal pulses.     Heart sounds: Normal heart sounds. No murmur heard.    No friction rub. No gallop.  Pulmonary:     Effort: Pulmonary effort is normal. No respiratory distress.     Breath sounds: Normal breath sounds. No wheezing.  Abdominal:     General: Bowel sounds are normal. There is no distension.     Palpations: Abdomen is soft.     Tenderness: There is no abdominal tenderness. There is no guarding.  Musculoskeletal:        General: Normal range of motion.     Cervical back: Normal range of motion and neck supple.  Lymphadenopathy:     Cervical: No cervical adenopathy.  Skin:    General: Skin is warm and dry.  Neurological:     Mental Status: She is alert and oriented to person, place, and time.     Cranial Nerves: No cranial nerve deficit.  Psychiatric:        Mood and Affect: Mood normal.        Behavior: Behavior normal.        Thought Content: Thought content normal.        Judgment: Judgment normal.      No results found for any visits on 10/14/23.     Assessment & Plan:    Routine Health Maintenance and Physical Exam  Immunization History  Administered Date(s) Administered   MMR 09/24/2023   Tdap 04/22/2021, 07/06/2023    Health Maintenance  Topic Date Due   Pneumococcal Vaccine 72-31 Years old (1 of 2 - PCV) Never done   Cervical Cancer Screening (HPV/Pap Cotest)  09/24/2023   INFLUENZA VACCINE  10/25/2023 (Originally 02/25/2023)   DTaP/Tdap/Td (3 - Td or Tdap) 07/05/2033   HIV Screening  Completed   HPV VACCINES  Aged Out   COVID-19 Vaccine  Discontinued   Hepatitis C Screening  Discontinued    Discussed health benefits of physical activity, and encouraged her to engage in regular exercise appropriate  for her age and condition.  1. Annual physical exam (Primary) Deferring labs today as she is recently postpartum and had labs checked before discharge.  She is due for lipid check however immediately postpartum, these numbers will be skewed.  She is overdue for vision and dental care but cites a lack of insurance as a barrier.  Preventative care provided  with AVS.  2.  Major depressive disorder, severe Stable.  Continue sertraline 50 mg daily.  Return in about 1 year (around 10/13/2024) for annual physical exam or sooner if needed.   Christen Butter, NP

## 2023-10-14 NOTE — Patient Instructions (Signed)

## 2023-10-28 NOTE — Progress Notes (Signed)
 Post Partum Visit Note  Sonya West is a 33 y.o. G38P1001 female who presents for a postpartum visit. She is 5 weeks postpartum following a normal spontaneous vaginal delivery.  I have fully reviewed the prenatal and intrapartum course. The delivery was at 40.0 gestational weeks.  Anesthesia: epidural. Postpartum course has been doing well, sleep deprived a little stressed. Baby is doing well. Baby is feeding by both breast and bottle - Similac Advance. Bleeding no bleeding. Bowel function is normal. Bladder function is normal. Patient is not sexually active. Contraception method is condoms. Postpartum depression screening: positive.   The pregnancy intention screening data noted above was reviewed. Potential methods of contraception were discussed. The patient elected to proceed with No data recorded.   Edinburgh Postnatal Depression Scale - 11/02/23 1450       Edinburgh Postnatal Depression Scale:  In the Past 7 Days   I have been able to laugh and see the funny side of things. 0    I have looked forward with enjoyment to things. 0    I have blamed myself unnecessarily when things went wrong. 1    I have been anxious or worried for no good reason. 1    I have felt scared or panicky for no good reason. 1    Things have been getting on top of me. 1    I have been so unhappy that I have had difficulty sleeping. 0    I have felt sad or miserable. 0    I have been so unhappy that I have been crying. 0    The thought of harming myself has occurred to me. 0    Edinburgh Postnatal Depression Scale Total 4             Health Maintenance Due  Topic Date Due   Pneumococcal Vaccine 37-54 Years old (1 of 2 - PCV) Never done   Cervical Cancer Screening (HPV/Pap Cotest)  09/24/2023    The following portions of the patient's history were reviewed and updated as appropriate: allergies, current medications, past family history, past medical history, past social history, past surgical history,  and problem list.  Review of Systems Pertinent items are noted in HPI.  Objective:  BP 121/89   Pulse 80   Wt 159 lb (72.1 kg)   LMP 12/21/2022   BMI 29.08 kg/m    General:  alert and cooperative   Breasts:  not indicated  Lungs: clear to auscultation bilaterally  Heart:  regular rate and rhythm, S1, S2 normal, no murmur, click, rub or gallop  Abdomen: soft, non-tender; bowel sounds normal; no masses,  no organomegaly        Assessment:   Normal postpartum exam.  Condoms for [facility] Pap/ annual later this year.   Plan:   Essential components of care per ACOG recommendations:  1.  Mood and well being: Patient with negative depression screening today. Reviewed local resources for support.  - Patient tobacco use? No.   - hx of drug use? No.    2. Infant care and feeding:  -Patient currently breastmilk feeding? Yes. Discussed returning to work and pumping.  -Social determinants of health (SDOH) reviewed in EPIC. No concerns  3. Sexuality, contraception and birth spacing - Patient  unsure  want a pregnancy in the next year.  Desired family size is unknown children.  - Reviewed reproductive life planning. Reviewed contraceptive methods based on pt preferences and effectiveness.  Patient desired Female Condom today.   -  Discussed birth spacing of 18 months  4. Sleep and fatigue -Encouraged family/partner/community support of 4 hrs of uninterrupted sleep to help with mood and fatigue  5. Physical Recovery  - Discussed patients delivery and complications. She describes her labor as good. - Patient had a Vaginal, no problems at delivery. Patient had a  Hemostatic right labial  laceration. Perineal healing reviewed. Patient expressed understanding - Patient has urinary incontinence? No. - Patient is safe to resume physical and sexual activity  6.  Health Maintenance - HM due items addressed Yes - Last pap smear  Diagnosis  Date Value Ref Range Status  09/23/2020   Final   -  Negative for intraepithelial lesion or malignancy (NILM)   Pap smear not done at today's visit.  -Breast Cancer screening indicated? No.   7. Chronic Disease/Pregnancy Condition follow up: None  - PCP follow up - Schedule pap and annual later this year. Still within ASCCP guidelines.   Venia Carbon, NP Center for [employer], [facility]

## 2023-11-02 ENCOUNTER — Ambulatory Visit: Payer: No Typology Code available for payment source | Admitting: Obstetrics and Gynecology

## 2023-11-13 ENCOUNTER — Other Ambulatory Visit: Payer: Self-pay | Admitting: Medical-Surgical

## 2023-11-13 DIAGNOSIS — R45851 Suicidal ideations: Secondary | ICD-10-CM

## 2023-11-13 DIAGNOSIS — F322 Major depressive disorder, single episode, severe without psychotic features: Secondary | ICD-10-CM

## 2023-11-24 ENCOUNTER — Encounter: Payer: Self-pay | Admitting: Medical-Surgical

## 2023-11-24 DIAGNOSIS — R45851 Suicidal ideations: Secondary | ICD-10-CM

## 2023-11-24 DIAGNOSIS — F322 Major depressive disorder, single episode, severe without psychotic features: Secondary | ICD-10-CM

## 2023-11-25 MED ORDER — SERTRALINE HCL 100 MG PO TABS: 100.0000 mg | ORAL_TABLET | Freq: Every day | ORAL | 3 refills | Status: AC

## 2023-12-22 ENCOUNTER — Ambulatory Visit: Admitting: Medical-Surgical

## 2024-02-15 ENCOUNTER — Encounter: Payer: Self-pay | Admitting: Medical-Surgical

## 2024-02-15 ENCOUNTER — Ambulatory Visit: Admitting: Medical-Surgical

## 2024-02-15 VITALS — BP 112/75 | HR 81 | Resp 20 | Ht 62.0 in | Wt 169.0 lb

## 2024-02-15 DIAGNOSIS — G4710 Hypersomnia, unspecified: Secondary | ICD-10-CM

## 2024-02-15 DIAGNOSIS — R635 Abnormal weight gain: Secondary | ICD-10-CM

## 2024-02-15 DIAGNOSIS — F322 Major depressive disorder, single episode, severe without psychotic features: Secondary | ICD-10-CM | POA: Diagnosis not present

## 2024-02-15 DIAGNOSIS — R5383 Other fatigue: Secondary | ICD-10-CM | POA: Diagnosis not present

## 2024-02-15 NOTE — Progress Notes (Signed)
 Established patient visit  Discussed the use of AI scribe software for clinical note transcription with the patient, who gave verbal consent to proceed.  History of Present Illness   Sonya West is a 33 year old female who presents with persistent fatigue and exhaustion.  Fatigue and daytime somnolence - Persistent fatigue and exhaustion despite obtaining six or more hours of sleep per night - Feels tired even after consuming energy drinks and caffeine - Frequent yawning and sleepiness at work, including while standing - Occasionally naps during the day when not working - No regular snoring - No observed episodes of apnea during sleep by her husband  Mood and psychosocial stressors - Mood affected by mother's declining health and associated childcare challenges for her infant - Mother and mother-in-law previously alternated weeks for childcare, but mother can no longer manage a full week - Husband is seeking new employment, contributing to stress - Currently taking sertraline  (Zoloft ) 100mg  daily and finds it effective for mood  Weight gain and nutrition - Recent weight gain attributed to increased eating and reliance on instant meals due to time and financial constraints - Nutrition is adequate - Physical activity primarily through work at [employer] and caring for her active infant  Postpartum anemia - History of mild anemia postpartum with hemoglobin of 11.9 g/dL in February - No unusual cravings such as chewing on ice        Physical Exam Vitals reviewed.  Constitutional:      General: She is not in acute distress.    Appearance: Normal appearance. She is obese. She is not ill-appearing.  HENT:     Head: Normocephalic and atraumatic.  Cardiovascular:     Rate and Rhythm: Normal rate and regular rhythm.     Pulses: Normal pulses.     Heart sounds: Normal heart sounds. No murmur heard.    No friction rub. No gallop.  Pulmonary:     Effort: Pulmonary effort is  normal. No respiratory distress.     Breath sounds: Normal breath sounds. No wheezing.  Skin:    General: Skin is warm and dry.     Coloration: Skin is pale.  Neurological:     Mental Status: She is alert and oriented to person, place, and time.  Psychiatric:        Mood and Affect: Mood normal.        Behavior: Behavior normal.        Thought Content: Thought content normal.        Judgment: Judgment normal.    Assessment and Plan    Fatigue Persistent fatigue despite adequate sleep. 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 No significant history of snoring or sleep apnea. Differential includes anemia, thyroid dysfunction, vitamin deficiencies, and sleep disorders. - Order CBC, Iron  panel, TSH, free T4, Vitamin D , Vitamin B12, CMP, and cortisol. - If no metabolic cause is found, consider adding Wellbutrin daily.    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 Mood improved but recent stress due to family issues. Anxiety related to family and work. Current treatment effective, requires monitoring. - Continue sertraline . - Monitor mood and anxiety symptoms.  Weight Gain Recent weight gain with dissatisfaction. Increased appetite and reliance on instant meals. Physical activity through work and childcare. Discussed meal preparation challenges. - Recommend searching for frugal shopping tips for healthy meals on the internet.  - Work on getting regular intentional exercise in small bursts throughout the day.  - May  benefit from adding Wellbutrin as noted above.      Return if symptoms worsen or fail to improve. Further follow up pending lab results.   __________________________________ Zada FREDRIK Palin, DNP, APRN, FNP-BC Primary Care and Sports Medicine [facility]

## 2024-02-16 ENCOUNTER — Ambulatory Visit: Payer: Self-pay | Admitting: Medical-Surgical

## 2024-02-16 LAB — CBC WITH DIFFERENTIAL/PLATELET
Basophils Absolute: 0.1 x10E3/uL (ref 0.0–0.2)
Basos: 1 %
EOS (ABSOLUTE): 0.1 x10E3/uL (ref 0.0–0.4)
Eos: 2 %
Hematocrit: 43.2 % (ref 34.0–46.6)
Hemoglobin: 14.5 g/dL (ref 11.1–15.9)
Immature Grans (Abs): 0 x10E3/uL (ref 0.0–0.1)
Immature Granulocytes: 0 %
Lymphocytes Absolute: 2.2 x10E3/uL (ref 0.7–3.1)
Lymphs: 29 %
MCH: 30.3 pg (ref 26.6–33.0)
MCHC: 33.6 g/dL (ref 31.5–35.7)
MCV: 90 fL (ref 79–97)
Monocytes Absolute: 0.6 x10E3/uL (ref 0.1–0.9)
Monocytes: 7 %
Neutrophils Absolute: 4.7 x10E3/uL (ref 1.4–7.0)
Neutrophils: 61 %
Platelets: 326 x10E3/uL (ref 150–450)
RBC: 4.79 x10E6/uL (ref 3.77–5.28)
RDW: 12.6 % (ref 11.7–15.4)
WBC: 7.6 x10E3/uL (ref 3.4–10.8)

## 2024-02-16 LAB — TSH: TSH: 2.47 u[IU]/mL (ref 0.450–4.500)

## 2024-02-16 LAB — CMP14+EGFR
ALT: 12 IU/L (ref 0–32)
AST: 16 IU/L (ref 0–40)
Albumin: 4.3 g/dL (ref 3.9–4.9)
Alkaline Phosphatase: 75 IU/L (ref 44–121)
BUN/Creatinine Ratio: 26 — ABNORMAL HIGH (ref 9–23)
BUN: 16 mg/dL (ref 6–20)
Bilirubin Total: 0.3 mg/dL (ref 0.0–1.2)
CO2: 21 mmol/L (ref 20–29)
Calcium: 9.3 mg/dL (ref 8.7–10.2)
Chloride: 102 mmol/L (ref 96–106)
Creatinine, Ser: 0.61 mg/dL (ref 0.57–1.00)
Globulin, Total: 3.1 g/dL (ref 1.5–4.5)
Glucose: 75 mg/dL (ref 70–99)
Potassium: 3.9 mmol/L (ref 3.5–5.2)
Sodium: 140 mmol/L (ref 134–144)
Total Protein: 7.4 g/dL (ref 6.0–8.5)
eGFR: 122 mL/min/1.73 (ref >59)

## 2024-02-16 LAB — IRON,TIBC AND FERRITIN PANEL
Ferritin: 40 ng/mL (ref 15–150)
Iron Saturation: 10 % — ABNORMAL LOW (ref 15–55)
Iron: 35 ug/dL (ref 27–159)
Total Iron Binding Capacity: 366 ug/dL (ref 250–450)
UIBC: 331 ug/dL (ref 131–425)

## 2024-02-16 LAB — VITAMIN D 25 HYDROXY (VIT D DEFICIENCY, FRACTURES): Vit D, 25-Hydroxy: 23.7 ng/mL — ABNORMAL LOW (ref 30.0–100.0)

## 2024-02-16 LAB — CORTISOL: Cortisol: 9.5 ug/dL (ref 6.2–19.4)

## 2024-02-16 LAB — VITAMIN B12: Vitamin B-12: 1586 pg/mL — ABNORMAL HIGH (ref 232–1245)

## 2024-02-16 LAB — T4, FREE: Free T4: 0.88 ng/dL (ref 0.82–1.77)

## 2024-02-16 MED ORDER — IRON (FERROUS SULFATE) 325 (65 FE) MG PO TABS
325.0000 mg | ORAL_TABLET | Freq: Every day | ORAL | 0 refills | Status: DC
Start: 2024-02-16 — End: 2024-05-17

## 2024-03-20 ENCOUNTER — Encounter: Payer: Self-pay | Admitting: Medical-Surgical

## 2024-03-20 ENCOUNTER — Other Ambulatory Visit (HOSPITAL_COMMUNITY)
Admission: RE | Admit: 2024-03-20 | Discharge: 2024-03-20 | Disposition: A | Discharge status: Home / Self Care | Source: Ambulatory Visit | Attending: Medical-Surgical | Admitting: Medical-Surgical

## 2024-03-20 ENCOUNTER — Ambulatory Visit (INDEPENDENT_AMBULATORY_CARE_PROVIDER_SITE_OTHER): Admitting: Medical-Surgical

## 2024-03-20 VITALS — BP 123/79 | HR 100 | Temp 98.3°F | Resp 20 | Ht 62.0 in | Wt 165.7 lb

## 2024-03-20 DIAGNOSIS — R509 Fever, unspecified: Secondary | ICD-10-CM | POA: Diagnosis not present

## 2024-03-20 DIAGNOSIS — Z124 Encounter for screening for malignant neoplasm of cervix: Secondary | ICD-10-CM

## 2024-03-20 LAB — POC COVID19/FLU A&B COMBO
Covid Antigen, POC: NEGATIVE
Influenza A Antigen, POC: NEGATIVE
Influenza B Antigen, POC: NEGATIVE

## 2024-03-20 LAB — POCT RAPID STREP A (OFFICE): Rapid Strep A Screen: NEGATIVE

## 2024-03-20 MED ORDER — DEXAMETHASONE 4 MG PO TABS: 4.0000 mg | ORAL_TABLET | Freq: Two times a day (BID) | ORAL | 0 refills | Status: AC

## 2024-03-20 NOTE — Progress Notes (Signed)
        Established patient visit  History, exam, impression, and plan:  1. Cervical cancer screening (Primary) Pap smear with HPV cotesting completed in office today.  - Cytology - PAP  2. Fever, unspecified fever cause Reports sudden onset fever and laryngitis that started yesterday along with poor appetite and generalized malaise. Son tested + for COVID 3 days ago. Taking Tylenol  for fever/pain with some relief of symptoms. POCT strep/flu/covid negative today. Recommend retest for COVID in 48 hours. Start Decadron  4mg  BID x 5 days for laryngitis. Recommend symptomatic treatment using OTC/home options. - POCT rapid strep A - POC Covid19/Flu A&B Antigen  Procedures performed this visit: None.  Return if symptoms worsen or fail to improve.  __________________________________ Zada FREDRIK Palin, DNP, APRN, FNP-BC Primary Care and Sports Medicine [facility]

## 2024-03-21 ENCOUNTER — Ambulatory Visit: Payer: Self-pay | Admitting: Medical-Surgical

## 2024-03-21 LAB — CYTOLOGY - PAP
Adequacy: ABSENT
Comment: NEGATIVE
Diagnosis: NEGATIVE
High risk HPV: POSITIVE — AB

## 2024-03-21 NOTE — Telephone Encounter (Signed)
 Was not able to change modifier in patient chart to screen PAP In one year.

## 2024-05-14 ENCOUNTER — Other Ambulatory Visit: Payer: Self-pay | Admitting: Medical-Surgical

## 2024-05-17 ENCOUNTER — Other Ambulatory Visit: Payer: Self-pay | Admitting: Medical-Surgical

## 2024-05-17 DIAGNOSIS — E611 Iron deficiency: Secondary | ICD-10-CM

## 2024-05-20 ENCOUNTER — Ambulatory Visit: Payer: Self-pay | Admitting: Medical-Surgical

## 2024-05-20 LAB — IRON,TIBC AND FERRITIN PANEL
Ferritin: 94 ng/mL (ref 15–150)
Iron Saturation: 21 % (ref 15–55)
Iron: 76 ug/dL (ref 27–159)
Total Iron Binding Capacity: 363 ug/dL (ref 250–450)
UIBC: 287 ug/dL (ref 131–425)

## 2024-08-14 ENCOUNTER — Other Ambulatory Visit: Payer: Self-pay | Admitting: Medical-Surgical

## 2024-08-14 DIAGNOSIS — F322 Major depressive disorder, single episode, severe without psychotic features: Secondary | ICD-10-CM

## 2024-08-14 DIAGNOSIS — R45851 Suicidal ideations: Secondary | ICD-10-CM

## 2024-10-16 ENCOUNTER — Encounter: Admitting: Medical-Surgical
